# Patient Record
Sex: Female | Born: 1970 | Race: Black or African American | Hispanic: No | Marital: Single | Smoking: Never smoker
Health system: Southern US, Community
[De-identification: ages and names within clinical notes are randomized; demographics above are authoritative.]

## PROBLEM LIST (undated history)

## (undated) DIAGNOSIS — E559 Vitamin D deficiency, unspecified: Secondary | ICD-10-CM

## (undated) DIAGNOSIS — I10 Essential (primary) hypertension: Secondary | ICD-10-CM

## (undated) DIAGNOSIS — D219 Benign neoplasm of connective and other soft tissue, unspecified: Secondary | ICD-10-CM

## (undated) DIAGNOSIS — E876 Hypokalemia: Secondary | ICD-10-CM

## (undated) DIAGNOSIS — D649 Anemia, unspecified: Secondary | ICD-10-CM

## (undated) DIAGNOSIS — I272 Pulmonary hypertension, unspecified: Secondary | ICD-10-CM

## (undated) HISTORY — DX: Vitamin D deficiency, unspecified: E55.9

## (undated) HISTORY — DX: Anemia, unspecified: D64.9

## (undated) HISTORY — DX: Essential (primary) hypertension: I10

## (undated) HISTORY — DX: Hypomagnesemia: E83.42

## (undated) HISTORY — DX: Pulmonary hypertension, unspecified: I27.20

## (undated) HISTORY — DX: Hypokalemia: E87.6

---

## 1997-10-25 ENCOUNTER — Inpatient Hospital Stay (HOSPITAL_COMMUNITY): Admission: AD | Admit: 1997-10-25 | Discharge: 1997-10-27 | Payer: Self-pay | Admitting: Obstetrics & Gynecology

## 2002-08-28 ENCOUNTER — Other Ambulatory Visit: Admission: RE | Admit: 2002-08-28 | Discharge: 2002-08-28 | Payer: Self-pay | Admitting: Internal Medicine

## 2003-08-14 ENCOUNTER — Other Ambulatory Visit: Admission: RE | Admit: 2003-08-14 | Discharge: 2003-08-14 | Payer: Self-pay | Admitting: Internal Medicine

## 2004-08-25 ENCOUNTER — Other Ambulatory Visit: Admission: RE | Admit: 2004-08-25 | Discharge: 2004-08-25 | Payer: Self-pay | Admitting: Internal Medicine

## 2008-12-28 ENCOUNTER — Ambulatory Visit: Payer: Self-pay

## 2008-12-28 ENCOUNTER — Encounter: Payer: Self-pay | Admitting: Cardiology

## 2010-06-14 LAB — HM PAP SMEAR: HM PAP: NORMAL

## 2013-06-14 ENCOUNTER — Encounter: Payer: Self-pay | Admitting: Physician Assistant

## 2013-06-15 ENCOUNTER — Encounter: Payer: Self-pay | Admitting: Physician Assistant

## 2013-06-15 ENCOUNTER — Ambulatory Visit (INDEPENDENT_AMBULATORY_CARE_PROVIDER_SITE_OTHER): Payer: 59 | Admitting: Physician Assistant

## 2013-06-15 ENCOUNTER — Ambulatory Visit (HOSPITAL_COMMUNITY)
Admission: RE | Admit: 2013-06-15 | Discharge: 2013-06-15 | Disposition: A | Discharge status: Home / Self Care | Payer: 59 | Source: Ambulatory Visit | Attending: Physician Assistant | Admitting: Physician Assistant

## 2013-06-15 VITALS — BP 110/68 | HR 80 | Temp 97.9°F | Resp 16 | Ht 63.5 in | Wt 142.0 lb

## 2013-06-15 DIAGNOSIS — Z0000 Encounter for general adult medical examination without abnormal findings: Secondary | ICD-10-CM

## 2013-06-15 DIAGNOSIS — E559 Vitamin D deficiency, unspecified: Secondary | ICD-10-CM | POA: Insufficient documentation

## 2013-06-15 DIAGNOSIS — I10 Essential (primary) hypertension: Secondary | ICD-10-CM | POA: Insufficient documentation

## 2013-06-15 DIAGNOSIS — I272 Pulmonary hypertension, unspecified: Secondary | ICD-10-CM

## 2013-06-15 DIAGNOSIS — Z23 Encounter for immunization: Secondary | ICD-10-CM

## 2013-06-15 DIAGNOSIS — D649 Anemia, unspecified: Secondary | ICD-10-CM

## 2013-06-15 LAB — BASIC METABOLIC PANEL WITH GFR
BUN: 14 mg/dL (ref 6–23)
CHLORIDE: 103 meq/L (ref 96–112)
CO2: 26 mEq/L (ref 19–32)
Calcium: 9.2 mg/dL (ref 8.4–10.5)
Creat: 0.67 mg/dL (ref 0.50–1.10)
GFR, Est African American: >89 mL/min
GLUCOSE: 95 mg/dL (ref 70–99)
POTASSIUM: 3.8 meq/L (ref 3.5–5.3)
SODIUM: 138 meq/L (ref 135–145)

## 2013-06-15 LAB — IRON AND TIBC
%SAT: 3 % — ABNORMAL LOW (ref 20–55)
Iron: 14 ug/dL — ABNORMAL LOW (ref 42–145)
TIBC: 440 ug/dL (ref 250–470)
UIBC: 426 ug/dL — AB (ref 125–400)

## 2013-06-15 LAB — MAGNESIUM: Magnesium: 1.7 mg/dL (ref 1.5–2.5)

## 2013-06-15 LAB — CBC WITH DIFFERENTIAL/PLATELET
BASOS ABS: 0 10*3/uL (ref 0.0–0.1)
Basophils Relative: 0 % (ref 0–1)
Eosinophils Absolute: 0.1 10*3/uL (ref 0.0–0.7)
Eosinophils Relative: 2 % (ref 0–5)
HEMATOCRIT: 29.4 % — AB (ref 36.0–46.0)
HEMOGLOBIN: 9.6 g/dL — AB (ref 12.0–15.0)
LYMPHS PCT: 36 % (ref 12–46)
Lymphs Abs: 1.5 10*3/uL (ref 0.7–4.0)
MCH: 26.2 pg (ref 26.0–34.0)
MCHC: 32.7 g/dL (ref 30.0–36.0)
MCV: 80.3 fL (ref 78.0–100.0)
MONO ABS: 0.5 10*3/uL (ref 0.1–1.0)
MONOS PCT: 11 % (ref 3–12)
NEUTROS ABS: 2 10*3/uL (ref 1.7–7.7)
NEUTROS PCT: 51 % (ref 43–77)
Platelets: 324 10*3/uL (ref 150–400)
RBC: 3.66 MIL/uL — AB (ref 3.87–5.11)
RDW: 14.1 % (ref 11.5–15.5)
WBC: 4.1 10*3/uL (ref 4.0–10.5)

## 2013-06-15 LAB — FERRITIN: FERRITIN: 4 ng/mL — AB (ref 10–291)

## 2013-06-15 LAB — HEPATIC FUNCTION PANEL
ALK PHOS: 60 U/L (ref 39–117)
ALT: 8 U/L (ref 0–35)
AST: 13 U/L (ref 0–37)
Albumin: 3.9 g/dL (ref 3.5–5.2)
BILIRUBIN DIRECT: 0.1 mg/dL (ref 0.0–0.3)
BILIRUBIN INDIRECT: 0.1 mg/dL (ref 0.0–0.9)
TOTAL PROTEIN: 7.4 g/dL (ref 6.0–8.3)
Total Bilirubin: 0.2 mg/dL — ABNORMAL LOW (ref 0.3–1.2)

## 2013-06-15 LAB — URINALYSIS, ROUTINE W REFLEX MICROSCOPIC
Bilirubin Urine: NEGATIVE
Glucose, UA: NEGATIVE mg/dL
HGB URINE DIPSTICK: NEGATIVE
Ketones, ur: NEGATIVE mg/dL
LEUKOCYTES UA: NEGATIVE
NITRITE: NEGATIVE
PH: 5.5 (ref 5.0–8.0)
PROTEIN: NEGATIVE mg/dL
Specific Gravity, Urine: 1.021 (ref 1.005–1.030)
Urobilinogen, UA: 0.2 mg/dL (ref 0.0–1.0)

## 2013-06-15 LAB — LIPID PANEL
Cholesterol: 146 mg/dL (ref 0–200)
HDL: 44 mg/dL (ref >39)
LDL CALC: 92 mg/dL (ref 0–99)
TRIGLYCERIDES: 48 mg/dL (ref <150)
Total CHOL/HDL Ratio: 3.3 Ratio
VLDL: 10 mg/dL (ref 0–40)

## 2013-06-15 LAB — MICROALBUMIN / CREATININE URINE RATIO
Creatinine, Urine: 131.6 mg/dL
Microalb Creat Ratio: 4.9 mg/g (ref 0.0–30.0)
Microalb, Ur: 0.64 mg/dL (ref 0.00–1.89)

## 2013-06-15 LAB — HEMOGLOBIN A1C
Hgb A1c MFr Bld: 5 % (ref <5.7)
MEAN PLASMA GLUCOSE: 97 mg/dL (ref <117)

## 2013-06-15 LAB — TSH: TSH: 1.352 u[IU]/mL (ref 0.350–4.500)

## 2013-06-15 LAB — VITAMIN B12: Vitamin B-12: 667 pg/mL (ref 211–911)

## 2013-06-15 NOTE — Patient Instructions (Signed)
Please record yourself for possible sleep apnea Please follow up with your OB/GYN, this is very important for you PAP and MGM!  Get on an allergy pill like zyrtec/certizine at night.     Bad carbs also include fruit juice, alcohol, and sweet tea. These are empty calories that do not signal to your brain that you are full.   Please remember the good carbs are still carbs which convert into sugar. So please measure them out no more than 1/2-1 cup of rice, oatmeal, pasta, and beans.  Veggies are however free foods! Pile them on.   I like lean protein at every meal such as chicken, [place], pork chops, cottage cheese, etc. Just do not fry these meats and please center your meal around vegetable, the meats should be a side dish.   No all fruit is created equal. Please see the list below, the fruit at the bottom is higher in sugars than the fruit at the top   Sleep Apnea  Sleep apnea is a sleep disorder characterized by abnormal pauses in breathing while you sleep. When your breathing pauses, the level of oxygen in your blood decreases. This causes you to move out of deep sleep and into light sleep. As a result, your quality of sleep is poor, and the system that carries your blood throughout your body (cardiovascular system) experiences stress. If sleep apnea remains untreated, the following conditions can develop:  High blood pressure (hypertension).  Coronary artery disease.  Inability to achieve or maintain an erection (impotence).  Impairment of your thought process (cognitive dysfunction). There are three types of sleep apnea: 1. Obstructive sleep apnea Pauses in breathing during sleep because of a blocked airway. 2. Central sleep apnea Pauses in breathing during sleep because the area of the brain that controls your breathing does not send the correct signals to the muscles that control breathing. 3. Mixed sleep apnea A combination of both obstructive and central sleep apnea. RISK  FACTORS The following risk factors can increase your risk of developing sleep apnea:  Being overweight.  Smoking.  Having narrow passages in your nose and throat.  Being of older age.  Being female.  Alcohol use.  Sedative and tranquilizer use.  Ethnicity. Among individuals younger than 35 years, African Americans are at increased risk of sleep apnea. SYMPTOMS   Difficulty staying asleep.  Daytime sleepiness and fatigue.  Loss of energy.  Irritability.  Loud, heavy snoring.  Morning headaches.  Trouble concentrating.  Forgetfulness.  Decreased interest in sex. DIAGNOSIS  In order to diagnose sleep apnea, your caregiver will perform a physical examination. Your caregiver may suggest that you take a home sleep test. Your caregiver may also recommend that you spend the night in a sleep lab. In the sleep lab, several monitors record information about your heart, lungs, and brain while you sleep. Your leg and arm movements and blood oxygen level are also recorded. TREATMENT The following actions may help to resolve mild sleep apnea:  Sleeping on your side.   Using a decongestant if you have nasal congestion.   Avoiding the use of depressants, including alcohol, sedatives, and narcotics.   Losing weight and modifying your diet if you are overweight. There also are devices and treatments to help open your airway:  Oral appliances. These are custom-made mouthpieces that shift your lower jaw forward and slightly open your bite. This opens your airway.  Devices that create positive airway pressure. This positive pressure "splints" your airway open to help you  breathe better during sleep. The following devices create positive airway pressure:  Continuous positive airway pressure (CPAP) device. The CPAP device creates a continuous level of air pressure with an air pump. The air is delivered to your airway through a mask while you sleep. This continuous pressure keeps your  airway open.  Nasal expiratory positive airway pressure (EPAP) device. The EPAP device creates positive air pressure as you exhale. The device consists of single-use valves, which are inserted into each nostril and held in place by adhesive. The valves create very little resistance when you inhale but create much more resistance when you exhale. That increased resistance creates the positive airway pressure. This positive pressure while you exhale keeps your airway open, making it easier to breath when you inhale again.  Bilevel positive airway pressure (BPAP) device. The BPAP device is used mainly in patients with central sleep apnea. This device is similar to the CPAP device because it also uses an air pump to deliver continuous air pressure through a mask. However, with the BPAP machine, the pressure is set at two different levels. The pressure when you exhale is lower than the pressure when you inhale.  Surgery. Typically, surgery is only done if you cannot comply with less invasive treatments or if the less invasive treatments do not improve your condition. Surgery involves removing excess tissue in your airway to create a wider passage way. Document Released: 05/08/2002 Document Revised: 09/12/2012 Document Reviewed: 09/24/2011 [facility] Patient Information 2014 [place].

## 2013-06-15 NOTE — Progress Notes (Signed)
Complete Physical HPI Patient presents for complete physical.   Patient's blood pressure has been controlled at home. Patient denies chest pain, shortness of breath, dizziness. BP: 110/68 mmHg  Patient's cholesterol is diet controlled. The patient's cholesterol last visit was LDL 109. , HDL 42.  + allergy symptoms not on a medication.  In June her potassium was 3.3, she is on 25 HCTZ. Also in June her Iron was 224 but her H/H was 10.9 (11.1)/34.4. Magnesium was 1.7 last time too, she is not on mag.  Her vitamin D was 45.  On menses now and they are regular.   Current Medications:  Current Outpatient Prescriptions on File Prior to Visit  Medication Sig Dispense Refill  . lisinopril-hydrochlorothiazide (PRINZIDE,ZESTORETIC) 20-25 MG per tablet Take 1 tablet by mouth daily.       No current facility-administered medications on file prior to visit.   Health Maintenance:  Tetanus: 2003 needs Pneumovax: 2003 Flu vaccine: declines Zostavax: N/A Pap: neg 2012 MGM: never had one.  DEXA: N/A Colonoscopy: N/A EGD: N/A  Allergies: No Known Allergies Medical History:  Past Medical History  Diagnosis Date  . Hypertension   . Allergy   . Vitamin D deficiency   . Anemia   . Pulmonary hypertension Via Echo 2010    EF 65% RVSP 45   Surgical History: No past surgical history on file. Family History:  Family History  Problem Relation Age of Onset  . Hypertension Mother   . Hyperlipidemia Mother   . Hyperlipidemia Father   . Hypertension Father    Social History:  History   Social History  . Marital Status: Single    Spouse Name: N/A    Number of Children: N/A  . Years of Education: N/A   Occupational History  . Not on file.   Social History Main Topics  . Smoking status: Never Smoker   . Smokeless tobacco: Never Used  . Alcohol Use: No  . Drug Use: No  . Sexual Activity: Not on file   Other Topics Concern  . Not on file   Social History Narrative  . No narrative on  file   ROS Constitutional: Denies weight loss/gain, headaches, insomnia, fatigue, night sweats, and change in appetite. Eyes: Denies redness, blurred vision, diplopia, discharge, itchy, watery eyes.  ENT: Denies discharge, congestion, post nasal drip, sore throat, earache, hearing loss, dental pain, Tinnitus, Vertigo, Sinus pain, snoring.  Cardio: Denies chest pain, palpitations, irregular heartbeat, dyspnea, diaphoresis, orthopnea, PND, claudication, edema Respiratory: denies cough, dyspnea, pleurisy, hoarseness, wheezing.  Gastrointestinal: Denies dysphagia, heartburn, pain, cramps, nausea, vomiting, bloating, diarrhea, constipation, hematemesis, melena, hematochezia, hemorrhoids Genitourinary: Denies dysuria, frequency, urgency, nocturia, hesitancy, discharge, hematuria, flank pain Breast: Denies Breast lumps, nipple discharge, bleeding.  Musculoskeletal: Denies arthralgia, myalgia, stiffness, Jt. Swelling, pain, Skin: Denies pruritis, rash, hives,  acne, eczema, changing in skin lesion Neuro: Denies Weakness, tremor, incoordination, spasms, paresthesia, pain Psychiatric: Denies confusion, memory loss, sensory loss Endocrine: Denies change in weight, skin, hair change, nocturia, and paresthesia, Diabetic Denies Polys, visual blurring, hyper /hypo glycemic episodes.  Heme/Lymph: Denies Excessive bleeding, bruising, enlarged lymph nodes  Physical Exam: Estimated body mass index is 24.76 kg/(m^2) as calculated from the following:   Height as of this encounter: 5' 3.5" (1.613 m).   Weight as of this encounter: 142 lb (64.411 kg). Filed Vitals:   06/15/13 0847  BP: 110/68  Pulse: 80  Temp: 97.9 F (36.6 C)  Resp: 16   General Appearance: Well nourished, in no  apparent distress. Eyes: PERRLA, EOMs, conjunctiva no swelling or erythema, normal fundi and vessels. Sinuses: No Frontal/maxillary tenderness ENT/Mouth: Ext aud canals clear, normal light reflex with TMs without erythema,  bulging.  Good dentition. No erythema, swelling, or exudate on post pharynx. Tonsils not swollen or erythematous. Hearing normal.  Neck: Supple, thyroid normal. No bruits Respiratory: Respiratory effort normal, BS equal bilaterally without rales, rhonchi, wheezing or stridor. Cardio: RRR without murmurs, rubs or gallops. Brisk peripheral pulses without edema.  Chest: symmetric, with normal excursions and percussion. Breasts:defer Abdomen: Soft, +BS. Non tender, no guarding, rebound, hernias, masses, or organomegaly. .  Lymphatics: Non tender without lymphadenopathy.  Genitourinary: defer Musculoskeletal: Full ROM all peripheral extremities,5/5 strength, and normal gait. Skin: Warm, dry without rashes, lesions, ecchymosis.  Neuro: Cranial nerves intact, reflexes equal bilaterally. Normal muscle tone, no cerebellar symptoms. Sensation intact.  Psych: Awake and oriented X 3, normal affect, Insight and Judgment appropriate.   EKG: WNL no changes.  Assessment and Plan: Hypertension- continue medication, may have to stop HCTZ  Allergy- continue medication  Vitamin D deficiency- check level  Anemia- check labs  Pulmonary hypertension- get CXR, ? Sleep apnea- does not want a study at this time.   Hypokalemia- check BMP, we may have to stop the HCTZ or decrease it.  TDAP MGM and PAP at OB/GYN  Discussed med's effects and SE's. Screening labs and tests as requested with regular follow-up as recommended.   Vicie Mutters 8:54 AM

## 2013-06-16 LAB — INSULIN, FASTING: Insulin fasting, serum: 11 u[IU]/mL (ref 3–28)

## 2013-06-16 LAB — FOLATE RBC: RBC Folate: 757 ng/mL (ref >280)

## 2013-06-16 LAB — VITAMIN D 25 HYDROXY (VIT D DEFICIENCY, FRACTURES): VIT D 25 HYDROXY: 80 ng/mL (ref 30–89)

## 2013-06-24 ENCOUNTER — Other Ambulatory Visit: Payer: Self-pay | Admitting: Physician Assistant

## 2013-12-19 ENCOUNTER — Ambulatory Visit (INDEPENDENT_AMBULATORY_CARE_PROVIDER_SITE_OTHER): Payer: 59 | Admitting: Physician Assistant

## 2013-12-19 ENCOUNTER — Encounter: Payer: Self-pay | Admitting: Physician Assistant

## 2013-12-19 VITALS — BP 120/78 | HR 88 | Temp 98.1°F | Resp 16 | Wt 137.0 lb

## 2013-12-19 DIAGNOSIS — D649 Anemia, unspecified: Secondary | ICD-10-CM

## 2013-12-19 DIAGNOSIS — E876 Hypokalemia: Secondary | ICD-10-CM

## 2013-12-19 DIAGNOSIS — E559 Vitamin D deficiency, unspecified: Secondary | ICD-10-CM

## 2013-12-19 DIAGNOSIS — I10 Essential (primary) hypertension: Secondary | ICD-10-CM

## 2013-12-19 LAB — CBC WITH DIFFERENTIAL/PLATELET
BASOS ABS: 0 10*3/uL (ref 0.0–0.1)
Basophils Relative: 0 % (ref 0–1)
Eosinophils Absolute: 0.1 10*3/uL (ref 0.0–0.7)
Eosinophils Relative: 1 % (ref 0–5)
HCT: 28.9 % — ABNORMAL LOW (ref 36.0–46.0)
Hemoglobin: 9.3 g/dL — ABNORMAL LOW (ref 12.0–15.0)
Lymphocytes Relative: 21 % (ref 12–46)
Lymphs Abs: 1.4 10*3/uL (ref 0.7–4.0)
MCH: 23.7 pg — ABNORMAL LOW (ref 26.0–34.0)
MCHC: 32.2 g/dL (ref 30.0–36.0)
MCV: 73.7 fL — ABNORMAL LOW (ref 78.0–100.0)
Monocytes Absolute: 0.5 10*3/uL (ref 0.1–1.0)
Monocytes Relative: 8 % (ref 3–12)
NEUTROS ABS: 4.7 10*3/uL (ref 1.7–7.7)
Neutrophils Relative %: 70 % (ref 43–77)
PLATELETS: 340 10*3/uL (ref 150–400)
RBC: 3.92 MIL/uL (ref 3.87–5.11)
RDW: 15.3 % (ref 11.5–15.5)
WBC: 6.7 10*3/uL (ref 4.0–10.5)

## 2013-12-19 NOTE — Progress Notes (Addendum)
Assessment and Plan:  Hypertension: Continue medication, monitor blood pressure at home. Continue DASH diet. Hypokalemia- check BMP Hypomagnesia- check Mag Iron def anemia- check iron/CBC, follow up with OB/GYN, send back hemoccult.  Vitamin D Def- check level and continue medications.   Continue diet and meds as discussed. Further disposition pending results of labs.  Addendum: Labs show hypokalemia and hyponatremia- will stop the HCTZ and increase lisinopril to 40mg  daily Anemia is worse, encouraged to get hemoccult card, follow up OB/GYN and we will refer to GI.  Will follow up in 1 month  HPI 43 y.o. female  presents for 3 month follow up with hypertension, hyperlipidemia, prediabetes and vitamin D. Her blood pressure has been controlled at home, today their BP is BP: 120/78 mmHg She does workout. She denies chest pain, shortness of breath, dizziness.  She is not on cholesterol medication and denies myalgias. Her cholesterol is at goal. The cholesterol last visit was:   Lab Results  Component Value Date   CHOL 146 06/15/2013   HDL 44 06/15/2013   LDLCALC 92 06/15/2013   TRIG 48 06/15/2013   CHOLHDL 3.3 06/15/2013    Last A1C in the office was:  Lab Results  Component Value Date   HGBA1C 5.0 06/15/2013   Patient is on Vitamin D supplement.   Lab Results  Component Value Date   VD25OH 39 06/15/2013     She has had a low potassium in the past, she is on magnesium and OTC potassium pill, denies muscle cramps.  Lab Results  Component Value Date   CREATININE 0.67 06/15/2013   BUN 14 06/15/2013   NA 138 06/15/2013   K 3.8 06/15/2013   CL 103 06/15/2013   CO2 26 06/15/2013   Her iron was very very low last time and she was suppose to follow up for labs but never did, did not follow up with OB/GYN either. Will recheck iron/CBC today.  Lab Results  Component Value Date   WBC 4.1 06/15/2013   HGB 9.6* 06/15/2013   HCT 29.4* 06/15/2013   MCV 80.3 06/15/2013   PLT 324 06/15/2013   Lab  Results  Component Value Date   IRON 14* 06/15/2013   TIBC 440 06/15/2013   FERRITIN 4* 06/15/2013     Current Medications:  Current Outpatient Prescriptions on File Prior to Visit  Medication Sig Dispense Refill  . lisinopril-hydrochlorothiazide (PRINZIDE,ZESTORETIC) 20-25 MG per tablet TAKE ONE TABLET BY MOUTH ONCE DAILY  90 tablet  1   No current facility-administered medications on file prior to visit.   Medical History:  Past Medical History  Diagnosis Date  . Hypertension   . Allergy   . Vitamin D deficiency   . Anemia   . Pulmonary hypertension Via Echo 2010    EF 65% RVSP 45 (Dr. Stanford Breed)   Allergies: No Known Allergies   Review of Systems: [X]  = complains of  [ ]  = denies  General: Fatigue [ X] Fever [ ]  Chills [ ]  Weakness [ ]   Insomnia [ ]  Eyes: Redness [ ]  Blurred vision [ ]  Diplopia [ ]   ENT: Congestion [ ]  Sinus Pain [ ]  Post Nasal Drip [ ]  Sore Throat [ ]  Earache [ ]   Cardiac: Chest pain/pressure [ ]  SOB [ ]  Orthopnea [ ]   Palpitations [ ]   Paroxysmal nocturnal dyspnea[ ]  Claudication [ ]  Edema [ ]   Pulmonary: Cough [ ]  Wheezing[ ]   SOB [ ]   Snoring [ ]   GI: Nausea [ ]   Vomiting[ ]  Dysphagia[ ]  Heartburn[ ]  Abdominal pain [ ]  Constipation [ ] ; Diarrhea [ ] ; BRBPR [ ]  Melena[ ]  GU: Hematuria[ ]  Dysuria [ ]  Nocturia[ ]  Urgency [ ]   Hesitancy [ ]  Discharge [ ]  Neuro: Headaches[ ]  Vertigo[ ]  Paresthesias[ ]  Spasm [ ]  Speech changes [ ]  Incoordination [ ]   Ortho: Arthritis [ ]  Joint pain [ ]  Muscle pain [ ]  Joint swelling [ ]  Back Pain [ ]  Skin:  Rash [ ]   Pruritis [ ]  Change in skin lesion [ ]   Psych: Depression[ ]  Anxiety[ ]  Confusion [ ]  Memory loss [ ]   Heme/Lypmh: Bleeding [ ]  Bruising [ ]  Enlarged lymph nodes [ ]   Endocrine: Visual blurring [ ]  Paresthesia [ ]  Polyuria [ ]  Polydypsea [ ]    Heat/cold intolerance [ ]  Hypoglycemia [ ]   Family history- Review and unchanged Social history- Review and unchanged Physical Exam: BP 120/78  Pulse 88  Temp(Src) 98.1  F (36.7 C)  Resp 16  Wt 137 lb (62.143 kg) Wt Readings from Last 3 Encounters:  12/19/13 137 lb (62.143 kg)  06/15/13 142 lb (64.411 kg)   General Appearance: Well nourished, in no apparent distress. Eyes: PERRLA, EOMs, conjunctiva no swelling or erythema Sinuses: No Frontal/maxillary tenderness ENT/Mouth: Ext aud canals clear, TMs without erythema, bulging. No erythema, swelling, or exudate on post pharynx.  Tonsils not swollen or erythematous. Hearing normal.  Neck: Supple, thyroid normal.  Respiratory: Respiratory effort normal, BS equal bilaterally without rales, rhonchi, wheezing or stridor.  Cardio: RRR with no MRGs. Brisk peripheral pulses without edema.  Abdomen: Soft, + BS.  Non tender, no guarding, rebound, hernias, masses. Lymphatics: Non tender without lymphadenopathy.  Musculoskeletal: Full ROM, 5/5 strength, normal gait.  Skin: Warm, dry without rashes, lesions, ecchymosis.  Neuro: Cranial nerves intact. Normal muscle tone, no cerebellar symptoms. Sensation intact.  Psych: Awake and oriented X 3, normal affect, Insight and Judgment appropriate.    Vicie Mutters 5:02 PM

## 2013-12-19 NOTE — Patient Instructions (Signed)
Need to follow up with OB/GYN Need to send back hemoccult card   Potassium Content of Foods  The body needs potassium to control blood pressure and to keep the muscles and nervous system healthy. Here are some healthy foods below that are high in potassium. Also you can get the white lab of "NO SALT" salt substitute, 1/4 teaspoon of this is equivalent to 32meq potassium.   FOODS AND DRINKS HIGH IN POTASSIUM FOODS MODERATE IN POTASSIUM   Fruits  Avocado (cubed),  c / 50 g.  Banana (sliced), 75 g.  Cantaloupe (cubed), 80 g.  Honeydew, 1 wedge / 85 g.  Kiwi (sliced), 90 g.  Nectarine, 1 small / 129 g.  Orange, 1 medium / 131 g. Vegetables  Artichoke,  of a medium / 64 g.  Asparagus (boiled), 90 g..  Broccoli (boiled), 78 g.  Brussels sprout (boiled), 78 g.  Butternut squash (baked), 103 g.  Chickpea (cooked), 82 g.  Green peas (cooked), 80 g.  Kidney beans (cooked), 5 tbsp / 55 g.  Lima beans (cooked),  c / 43 g.  Navy beans (cooked),  c / 61 g.  Spinach (cooked),  c / 45 g.  Sweet potato (baked),  c / 50 g.  Tomato (chopped or sliced), 90 g.  Vegetable juice.  White mushrooms (cooked), 78 g.  Yam (cooked or baked),  c / 34 g.  Zucchini squash (boiled), 90 g. Other Foods and Drinks  Almonds (whole),  c / 36 g.  Fish, 3 oz / 85 g.  Nonfat fruit variety yogurt, 123 g.  Pistachio nuts, 1 oz / 28 g.  Pumpkin seeds, 1 oz / 28 g.  Red meat (broiled, cooked, grilled), 3 oz / 85 g.  Scallops (steamed), 3 oz / 85 g.  Spaghetti sauce,  c / 66 g.  Sunflower seeds (dry roasted), 1 oz / 28 g.  Veggie burger, 1 patty / 70 g. Fruits  Grapefruit,  of the fruit / 098 g  Plums (sliced), 83 g.  Tangerine, 1 large / 120 g. Vegetables  Carrots (boiled), 78 g.  Carrots (sliced), 61 g.  Rhubarb (cooked with sugar), 120 g.  Rutabaga (cooked), 120 g.  Yellow snap beans (cooked), 63 g. Other Foods and Drinks   Chicken breast (roasted and  chopped),  c / 70 g.  Pita bread, 1 large / 64 g.  Shrimp (steamed), 4 oz / 113 g.  Swiss cheese (diced), 70 g.     Anemia, Nonspecific Anemia is a condition in which the concentration of red blood cells or hemoglobin in the blood is below normal. Hemoglobin is a substance in red blood cells that carries oxygen to the tissues of the body. Anemia results in not enough oxygen reaching these tissues.  CAUSES  Common causes of anemia include:   Excessive bleeding. Bleeding may be internal or external. This includes excessive bleeding from periods (in women) or from the intestine.   Poor nutrition.   Chronic kidney, thyroid, and liver disease.  Bone marrow disorders that decrease red blood cell production.  Cancer and treatments for cancer.  HIV, AIDS, and their treatments.  Spleen problems that increase red blood cell destruction.  Blood disorders.  Excess destruction of red blood cells due to infection, medicines, and autoimmune disorders. SIGNS AND SYMPTOMS   Minor weakness.   Dizziness.   Headache.  Palpitations.   Shortness of breath, especially with exercise.   Paleness.  Cold sensitivity.  Indigestion.  Nausea.  Difficulty sleeping.  Difficulty concentrating. Symptoms may occur suddenly or they may develop slowly.  DIAGNOSIS  Additional blood tests are often needed. These help your health care provider determine the best treatment. Your health care provider will check your stool for blood and look for other causes of blood loss.  TREATMENT  Treatment varies depending on the cause of the anemia. Treatment can include:   Supplements of iron, vitamin L57, or folic acid.   Hormone medicines.   A blood transfusion. This may be needed if blood loss is severe.   Hospitalization. This may be needed if there is significant continual blood loss.   Dietary changes.  Spleen removal. HOME CARE INSTRUCTIONS Keep all follow-up appointments. It  often takes many weeks to correct anemia, and having your health care provider check on your condition and your response to treatment is very important. SEEK IMMEDIATE MEDICAL CARE IF:   You develop extreme weakness, shortness of breath, or chest pain.   You become dizzy or have trouble concentrating.  You develop heavy vaginal bleeding.   You develop a rash.   You have bloody or black, tarry stools.   You faint.   You vomit up blood.   You vomit repeatedly.   You have abdominal pain.  You have a fever or persistent symptoms for more than 2-3 days.   You have a fever and your symptoms suddenly get worse.   You are dehydrated.  MAKE SURE YOU:  Understand these instructions.  Will watch your condition.  Will get help right away if you are not doing well or get worse. Document Released: 06/25/2004 Document Revised: 01/18/2013 Document Reviewed: 11/11/2012 [facility] Patient Information 2015 [place], [place]. This information is not intended to replace advice given to you by your health care provider. Make sure you discuss any questions you have with your health care provider.

## 2013-12-20 LAB — BASIC METABOLIC PANEL WITH GFR
BUN: 12 mg/dL (ref 6–23)
CO2: 29 mEq/L (ref 19–32)
Calcium: 9.7 mg/dL (ref 8.4–10.5)
Chloride: 97 mEq/L (ref 96–112)
Creat: 0.68 mg/dL (ref 0.50–1.10)
Glucose, Bld: 83 mg/dL (ref 70–99)
POTASSIUM: 3.4 meq/L — AB (ref 3.5–5.3)
SODIUM: 134 meq/L — AB (ref 135–145)

## 2013-12-20 LAB — IRON AND TIBC
%SAT: 13 % — AB (ref 20–55)
Iron: 64 ug/dL (ref 42–145)
TIBC: 488 ug/dL — ABNORMAL HIGH (ref 250–470)
UIBC: 424 ug/dL — ABNORMAL HIGH (ref 125–400)

## 2013-12-20 LAB — HEPATIC FUNCTION PANEL
ALT: 10 U/L (ref 0–35)
AST: 17 U/L (ref 0–37)
Albumin: 4.3 g/dL (ref 3.5–5.2)
Alkaline Phosphatase: 59 U/L (ref 39–117)
BILIRUBIN INDIRECT: 0.4 mg/dL (ref 0.2–1.2)
Bilirubin, Direct: 0.1 mg/dL (ref 0.0–0.3)
Total Bilirubin: 0.5 mg/dL (ref 0.2–1.2)
Total Protein: 7.8 g/dL (ref 6.0–8.3)

## 2013-12-20 LAB — VITAMIN D 25 HYDROXY (VIT D DEFICIENCY, FRACTURES): VIT D 25 HYDROXY: 35 ng/mL (ref 30–89)

## 2013-12-20 LAB — FERRITIN: FERRITIN: 4 ng/mL — AB (ref 10–291)

## 2013-12-20 LAB — MAGNESIUM: MAGNESIUM: 1.8 mg/dL (ref 1.5–2.5)

## 2013-12-20 LAB — TSH: TSH: 1.347 u[IU]/mL (ref 0.350–4.500)

## 2013-12-20 MED ORDER — LISINOPRIL 40 MG PO TABS: 40.0000 mg | ORAL_TABLET | Freq: Every day | ORAL | Status: DC

## 2013-12-20 NOTE — Addendum Note (Signed)
Addended by: Vladimir Crofts on: 12/20/2013 08:43 AM   Modules accepted: Orders

## 2013-12-26 ENCOUNTER — Encounter: Payer: Self-pay | Admitting: Internal Medicine

## 2014-01-22 ENCOUNTER — Other Ambulatory Visit (INDEPENDENT_AMBULATORY_CARE_PROVIDER_SITE_OTHER): Payer: 59

## 2014-01-22 ENCOUNTER — Other Ambulatory Visit: Payer: Self-pay

## 2014-01-22 ENCOUNTER — Encounter (INDEPENDENT_AMBULATORY_CARE_PROVIDER_SITE_OTHER): Payer: Self-pay

## 2014-01-22 DIAGNOSIS — E876 Hypokalemia: Secondary | ICD-10-CM

## 2014-01-22 DIAGNOSIS — D649 Anemia, unspecified: Secondary | ICD-10-CM

## 2014-01-22 LAB — CBC WITH DIFFERENTIAL/PLATELET
Basophils Absolute: 0 10*3/uL (ref 0.0–0.1)
Basophils Relative: 0 % (ref 0–1)
Eosinophils Absolute: 0.2 10*3/uL (ref 0.0–0.7)
Eosinophils Relative: 3 % (ref 0–5)
HCT: 32.8 % — ABNORMAL LOW (ref 36.0–46.0)
HEMOGLOBIN: 10.6 g/dL — AB (ref 12.0–15.0)
Lymphocytes Relative: 33 % (ref 12–46)
Lymphs Abs: 1.9 10*3/uL (ref 0.7–4.0)
MCH: 26.2 pg (ref 26.0–34.0)
MCHC: 32.3 g/dL (ref 30.0–36.0)
MCV: 81 fL (ref 78.0–100.0)
MONOS PCT: 7 % (ref 3–12)
Monocytes Absolute: 0.4 10*3/uL (ref 0.1–1.0)
NEUTROS ABS: 3.4 10*3/uL (ref 1.7–7.7)
NEUTROS PCT: 57 % (ref 43–77)
Platelets: 269 10*3/uL (ref 150–400)
RBC: 4.05 MIL/uL (ref 3.87–5.11)
RDW: 19.4 % — ABNORMAL HIGH (ref 11.5–15.5)
WBC: 5.9 10*3/uL (ref 4.0–10.5)

## 2014-01-22 LAB — BASIC METABOLIC PANEL WITH GFR
BUN: 15 mg/dL (ref 6–23)
CO2: 24 mEq/L (ref 19–32)
Calcium: 9.3 mg/dL (ref 8.4–10.5)
Chloride: 105 mEq/L (ref 96–112)
Creat: 0.68 mg/dL (ref 0.50–1.10)
GFR, Est Non African American: >89 mL/min
GLUCOSE: 102 mg/dL — AB (ref 70–99)
POTASSIUM: 3.7 meq/L (ref 3.5–5.3)
SODIUM: 139 meq/L (ref 135–145)

## 2014-01-22 LAB — IRON AND TIBC
%SAT: 94 % — ABNORMAL HIGH (ref 20–55)
Iron: 419 ug/dL — ABNORMAL HIGH (ref 42–145)
TIBC: 447 ug/dL (ref 250–470)
UIBC: 28 ug/dL — AB (ref 125–400)

## 2014-01-23 LAB — FERRITIN: Ferritin: 12 ng/mL (ref 10–291)

## 2014-02-01 ENCOUNTER — Encounter: Payer: Self-pay | Admitting: Gastroenterology

## 2014-03-05 ENCOUNTER — Encounter: Payer: Self-pay | Admitting: Internal Medicine

## 2014-03-05 ENCOUNTER — Ambulatory Visit (INDEPENDENT_AMBULATORY_CARE_PROVIDER_SITE_OTHER): Payer: 59 | Admitting: Internal Medicine

## 2014-03-05 VITALS — BP 162/98 | HR 92 | Ht 63.5 in | Wt 142.4 lb

## 2014-03-05 DIAGNOSIS — N920 Excessive and frequent menstruation with regular cycle: Secondary | ICD-10-CM

## 2014-03-05 DIAGNOSIS — D500 Iron deficiency anemia secondary to blood loss (chronic): Secondary | ICD-10-CM

## 2014-03-05 NOTE — Patient Instructions (Signed)
We have set you up an appointment with Dr. Arvella Nigh at [facility] for October 13th at 10:30am, arrive at 10:00am.  They are located at [place]. , phone # [phone].   I appreciate the opportunity to care for you.

## 2014-03-05 NOTE — Progress Notes (Signed)
HPI: Pt is a 43 yo female referred for evaluation of anemia. Pt says she has been anemic for 2 years. She started an OTC iron supplement a few months ago, and her ferritin level normalized and she was instructed to stop her iron 3 weeks ago. She has had no change in bowel habits or stool caliber. No loss of appetite or wt loss. Had occasional dark stool on iron but not since. No BRBPR. No epigastric pain, nausea or vomiting. Her LMP was 2 weeks ago. Her menses are every 28 days and are very heavy. She passes a lot of clots, and typically saturates 4-5 pads daily. She has cramping with menses.   Past Medical History  Diagnosis Date  . Hypertension   . Allergy   . Vitamin D deficiency   . Anemia   . Pulmonary hypertension Via Echo 2010    EF 65% RVSP 45 (Dr. Stanford Breed)  . Hypokalemia   . Hypomagnesemia       Family History  Problem Relation Age of Onset  . Hypertension Mother   . Hyperlipidemia Mother   . Hyperlipidemia Father   . Hypertension Father    History  Substance Use Topics  . Smoking status: Never Smoker   . Smokeless tobacco: Never Used  . Alcohol Use: No   Current Outpatient Prescriptions  Medication Sig Dispense Refill  . lisinopril (PRINIVIL,ZESTRIL) 40 MG tablet Take 1 tablet (40 mg total) by mouth daily.  90 tablet  0   No current facility-administered medications for this visit.   No Known Allergies   Review of Systems: All systems reviewed and negative except where noted in HPI.    LABS:  Ferritin 8 mos ago 4 Ferritin 1 mos ago 12  HgB 8 mos ago 9.6 HgB 1 mos ago 10.6  MCV 1 mos ago 81      Physical Exam: BP 162/98  Pulse 92  Ht 5' 3.5" (1.613 m)  Wt 142 lb 6.4 oz (64.592 kg)  BMI 24.83 kg/m2  LMP 02/21/2014 Constitutional: Pleasant,well-developed, well nourished, AAfemale in no acute distress. HEENT: Normocephalic and atraumatic. Conjunctivae are normal. No scleral icterus. Neck supple.  Cardiovascular: Normal rate, regular rhythm.   Pulmonary/chest: Effort normal and breath sounds normal. No wheezing, rales or rhonchi. Abdominal: Soft, nondistended, nontender. Bowel sounds active throughout. No hepatomegaly.The uterus is palpable and firm, 2 fingerbreaths above umbilicus Rectal: DRE with brown stool that tests heme neg Extremities: no edema Lymphadenopathy: No cervical adenopathy noted. Neurological: Alert and oriented to person place and time. Skin: Skin is warm and dry. No rashes noted. Psychiatric: Normal mood and affect. Behavior is normal.   ASSESSMENT AND PLAN: 1. Iron deficiency anemia. Pts anemia has improved with oral iron supplementation, but her HgB is still not normal. Anemia likely due to heavy menstrual blood loss. Pt has not seen GYN in several years. Will schedule GYN eval. Prior to pursuing any endoscopic evaluations. 2.Menorrhagia. ? If due to fibroids/adenomyosis . Pt to be schedule to see GYN.    Todd Jelinski, Vita Barley PA-C 03/05/2014, 3:44 PM  I have seen the patient with Ms. Michelina Mexicano and agree with above plans.  Cc Arvella Nigh, MD Alesia Richards, MD

## 2014-03-12 ENCOUNTER — Encounter: Payer: Self-pay | Admitting: Physician Assistant

## 2014-03-12 ENCOUNTER — Ambulatory Visit (INDEPENDENT_AMBULATORY_CARE_PROVIDER_SITE_OTHER): Payer: 59 | Admitting: Physician Assistant

## 2014-03-12 VITALS — BP 138/82 | HR 88 | Temp 98.1°F | Resp 16 | Ht 63.5 in | Wt 142.0 lb

## 2014-03-12 DIAGNOSIS — R6889 Other general symptoms and signs: Secondary | ICD-10-CM

## 2014-03-12 DIAGNOSIS — E876 Hypokalemia: Secondary | ICD-10-CM

## 2014-03-12 DIAGNOSIS — I10 Essential (primary) hypertension: Secondary | ICD-10-CM

## 2014-03-12 DIAGNOSIS — Z0001 Encounter for general adult medical examination with abnormal findings: Secondary | ICD-10-CM

## 2014-03-12 DIAGNOSIS — R1900 Intra-abdominal and pelvic swelling, mass and lump, unspecified site: Secondary | ICD-10-CM

## 2014-03-12 DIAGNOSIS — R1031 Right lower quadrant pain: Secondary | ICD-10-CM

## 2014-03-12 DIAGNOSIS — D509 Iron deficiency anemia, unspecified: Secondary | ICD-10-CM

## 2014-03-12 DIAGNOSIS — G8929 Other chronic pain: Secondary | ICD-10-CM

## 2014-03-12 LAB — CBC WITH DIFFERENTIAL/PLATELET
BASOS ABS: 0 10*3/uL (ref 0.0–0.1)
Basophils Relative: 0 % (ref 0–1)
EOS ABS: 0.1 10*3/uL (ref 0.0–0.7)
EOS PCT: 1 % (ref 0–5)
HCT: 33.5 % — ABNORMAL LOW (ref 36.0–46.0)
Hemoglobin: 11.2 g/dL — ABNORMAL LOW (ref 12.0–15.0)
Lymphocytes Relative: 24 % (ref 12–46)
Lymphs Abs: 1.4 10*3/uL (ref 0.7–4.0)
MCH: 27.1 pg (ref 26.0–34.0)
MCHC: 33.4 g/dL (ref 30.0–36.0)
MCV: 81.1 fL (ref 78.0–100.0)
Monocytes Absolute: 0.4 10*3/uL (ref 0.1–1.0)
Monocytes Relative: 7 % (ref 3–12)
NEUTROS PCT: 68 % (ref 43–77)
Neutro Abs: 4.1 10*3/uL (ref 1.7–7.7)
Platelets: 407 10*3/uL — ABNORMAL HIGH (ref 150–400)
RBC: 4.13 MIL/uL (ref 3.87–5.11)
RDW: 15.7 % — AB (ref 11.5–15.5)
WBC: 6 10*3/uL (ref 4.0–10.5)

## 2014-03-12 NOTE — Progress Notes (Signed)
Complete Physical  Assessment and Plan: HTN-- continue medications, DASH diet, exercise and monitor at home. Call if greater than 130/80.   Allergic rhinitis- Allegra OTC, increase H20, allergy hygiene explained.  Vitamin D deficiency- continue supplement  Anemia- gets labs, continue iron, seeing Ob/GYN  Pulmonary hypertension- RVSP 45- asymptomatic  Hypokalemia- check BMP, off HCTZ  Hypomagnesemia- check mag, off HCTZ  RLQ tenderness with new fixed palpable uterus and questionable RLQ mass- distinctly new from previous visits- will get [place] tomorrow  Discussed med's effects and SE's. Screening labs and tests as requested with regular follow-up as recommended.  HPI 43 y.o. female  presents for a complete physical. She has had elevated blood pressure for 7-8 years. Her blood pressure has been controlled at home, today their BP is BP: 138/82 mmHg Her last labs showed hypokalemia/hyponatremia so she was taken off the HCTZ and lisinopril was increased to 40mg  daily. Denies edema.  She does workout. She denies chest pain, shortness of breath, dizziness.  She is not on cholesterol medication and denies myalgias. Her cholesterol is at goal. The cholesterol last visit was:   Lab Results  Component Value Date   CHOL 146 06/15/2013   HDL 44 06/15/2013   LDLCALC 92 06/15/2013   TRIG 48 06/15/2013   CHOLHDL 3.3 06/15/2013   Last A1C in the office was:  Lab Results  Component Value Date   HGBA1C 5.0 06/15/2013   She has had iron def anemia, she was sent to GI and referred back to her OB/GYN, she has seen Dr. Carlean Purl and he believes that her anemia is likely from her heavy menses, she follows with her OB/GYN tomorrow at [facility]. She is complaining of right lower quadrant pain and flank pain for the past 2 weeks, intermittent, nothing makes it worse, aleve/tylenol does not help. Denies nausea, diarrhea, constipation. LMP was 02/26/2014. Pain started after that. Very heavy menses, she wears  both pads and tampons during the day, does 4 tampons in a day with bleeding through to the pad often,  lasts for 5 days. She complains of increased frequency, declines night sweats, decreased appetie, weight loss.  Patient is on Vitamin D supplement.   Lab Results  Component Value Date   VD25OH 35 12/19/2013     She works as a Glass blower/designer, is in a same sex monogamous relationship x20+ years and has 2 children, 22 finished college at [employer] and 16.   Current Medications:  Current Outpatient Prescriptions on File Prior to Visit  Medication Sig Dispense Refill  . lisinopril (PRINIVIL,ZESTRIL) 40 MG tablet Take 1 tablet (40 mg total) by mouth daily.  90 tablet  0   No current facility-administered medications on file prior to visit.   Health Maintenance:   Immunization History  Administered Date(s) Administered  . Pneumococcal-Unspecified 06/14/2001  . Td 06/14/2001  . Tdap 06/15/2013   Tetanus: 2015 Pneumovax: 2003 Flu vaccine: declines Zostavax: N/A Pap: 2012 see OB/GYN MGM: will get at OB/GYN DEXA: N/A Colonoscopy: N/A EGD: N/A Echo: 2010 PHTN 50, EF 65% DEE- unknown name 2013 q 2 years Dentist- Dr. Arrie Aran q 6 months  Allergies: No Known Allergies Medical History:  Past Medical History  Diagnosis Date  . Hypertension   . Allergy   . Vitamin D deficiency   . Anemia   . Pulmonary hypertension Via Echo 2010    EF 65% RVSP 45 (Dr. Stanford Breed)  . Hypokalemia   . Hypomagnesemia    Surgical History: No past surgical  history on file. Family History:  Family History  Problem Relation Age of Onset  . Hypertension Mother   . Hyperlipidemia Mother   . Hyperlipidemia Father   . Hypertension Father    Social History:  History  Substance Use Topics  . Smoking status: Never Smoker   . Smokeless tobacco: Never Used  . Alcohol Use: No   Review of Systems: see HPI  Physical Exam: Estimated body mass index is 24.76 kg/(m^2) as calculated from the following:   Height as of  this encounter: 5' 3.5" (1.613 m).   Weight as of this encounter: 142 lb (64.411 kg). BP 138/82  Pulse 88  Temp(Src) 98.1 F (36.7 C)  Resp 16  Ht 5' 3.5" (1.613 m)  Wt 142 lb (64.411 kg)  BMI 24.76 kg/m2  LMP 02/21/2014 General Appearance: Well nourished, in no apparent distress. Eyes: PERRLA, EOMs, conjunctiva no swelling or erythema, normal fundi and vessels. Sinuses: No Frontal/maxillary tenderness ENT/Mouth: Ext aud canals clear, normal light reflex with TMs without erythema, bulging.  Good dentition. No erythema, swelling, or exudate on post pharynx. Tonsils not swollen or erythematous. Hearing normal.  Neck: Supple, thyroid normal. No bruits Respiratory: Respiratory effort normal, BS equal bilaterally without rales, rhonchi, wheezing or stridor. Cardio: RRR without murmurs, questionable S3/S4 gallop. Brisk peripheral pulses without edema.  Chest: symmetric, with normal excursions and percussion. Breasts: defer Abdomen:  +BS. Uterus firm, fixed to the level above the naval, with questionable tender right lower quadrant mass. no guarding, rebound, hernias.  Lymphatics: Non tender without lymphadenopathy.  Genitourinary: defer Musculoskeletal: Full ROM all peripheral extremities,5/5 strength, and normal gait. Skin: Warm, dry without rashes, lesions, ecchymosis.  Neuro: Cranial nerves intact, reflexes equal bilaterally. Normal muscle tone, no cerebellar symptoms. Sensation intact.  Psych: Awake and oriented X 3, normal affect, Insight and Judgment appropriate.   EKG: WNL no changes.  Vicie Mutters 9:05 AM

## 2014-03-12 NOTE — Patient Instructions (Addendum)
Use a dropper to put olive oil or canola oil in the effected ear- 2-3 times a week. Let it soak for 20-30 min then you can take a shower or use a baby bulb with warm water to wash out the ear wax.  Do not use Qtips  Preventative Care for Adults - Female      MAINTAIN REGULAR HEALTH EXAMS:  A routine yearly physical is a good way to check in with your primary care provider about your health and preventive screening. It is also an opportunity to share updates about your health and any concerns you have, and receive a thorough all-over exam.   Most health insurance companies pay for at least some preventative services.  Check with your health plan for specific coverages.  WHAT PREVENTATIVE SERVICES DO WOMEN NEED?  Adult women should have their weight and blood pressure checked regularly.   Women age 71 and older should have their cholesterol levels checked regularly.  Women should be screened for cervical cancer with a Pap smear and pelvic exam beginning at either age 83, or 3 years after they become sexually activity.    Breast cancer screening generally begins at age 19 with a mammogram and breast exam by your primary care provider.    Beginning at age 37 and continuing to age 12, women should be screened for colorectal cancer.  Certain people may need continued testing until age 12.  Updating vaccinations is part of preventative care.  Vaccinations help protect against diseases such as the flu.  Osteoporosis is a disease in which the bones lose minerals and strength as we age. Women ages 72 and over should discuss this with their caregivers, as should women after menopause who have other risk factors.  Lab tests are generally done as part of preventative care to screen for anemia and blood disorders, to screen for problems with the kidneys and liver, to screen for bladder problems, to check blood sugar, and to check your cholesterol level.  Preventative services generally include  counseling about diet, exercise, avoiding tobacco, drugs, excessive alcohol consumption, and sexually transmitted infections.    GENERAL RECOMMENDATIONS FOR GOOD HEALTH:  Healthy diet:  Eat a variety of foods, including fruit, vegetables, animal or vegetable protein, such as meat, fish, chicken, and eggs, or beans, lentils, tofu, and grains, such as rice.  Drink plenty of water daily.  Decrease saturated fat in the diet, avoid lots of red meat, processed foods, sweets, fast foods, and fried foods.  Exercise:  Aerobic exercise helps maintain good heart health. At least 30-40 minutes of moderate-intensity exercise is recommended. For example, a brisk walk that increases your heart rate and breathing. This should be done on most days of the week.   Find a type of exercise or a variety of exercises that you enjoy so that it becomes a part of your daily life.  Examples are running, walking, swimming, water aerobics, and biking.  For motivation and support, explore group exercise such as aerobic class, spin class, Zumba, Yoga,or  martial arts, etc.    Set exercise goals for yourself, such as a certain weight goal, walk or run in a race such as a 5k walk/run.  Speak to your primary care provider about exercise goals.  Disease prevention:  If you smoke or chew tobacco, find out from your caregiver how to quit. It can literally save your life, no matter how long you have been a tobacco user. If you do not use tobacco, never begin.  Maintain a healthy diet and normal weight. Increased weight leads to problems with blood pressure and diabetes.   The Body Mass Index or BMI is a way of measuring how much of your body is fat. Having a BMI above 27 increases the risk of heart disease, diabetes, hypertension, stroke and other problems related to obesity. Your caregiver can help determine your BMI and based on it develop an exercise and dietary program to help you achieve or maintain this important  measurement at a healthful level.  High blood pressure causes heart and blood vessel problems.  Persistent high blood pressure should be treated with medicine if weight loss and exercise do not work.   Fat and cholesterol leaves deposits in your arteries that can block them. This causes heart disease and vessel disease elsewhere in your body.  If your cholesterol is found to be high, or if you have heart disease or certain other medical conditions, then you may need to have your cholesterol monitored frequently and be treated with medication.   Ask if you should have a cardiac stress test if your history suggests this. A stress test is a test done on a treadmill that looks for heart disease. This test can find disease prior to there being a problem.  Menopause can be associated with physical symptoms and risks. Hormone replacement therapy is available to decrease these. You should talk to your caregiver about whether starting or continuing to take hormones is right for you.   Osteoporosis is a disease in which the bones lose minerals and strength as we age. This can result in serious bone fractures. Risk of osteoporosis can be identified using a bone density scan. Women ages 68 and over should discuss this with their caregivers, as should women after menopause who have other risk factors. Ask your caregiver whether you should be taking a calcium supplement and Vitamin D, to reduce the rate of osteoporosis.   Avoid drinking alcohol in excess (more than two drinks per day).  Avoid use of street drugs. Do not share needles with anyone. Ask for professional help if you need assistance or instructions on stopping the use of alcohol, cigarettes, and/or drugs.  Brush your teeth twice a day with fluoride toothpaste, and floss once a day. Good oral hygiene prevents tooth decay and gum disease. The problems can be painful, unattractive, and can cause other health problems. Visit your dentist for a routine oral  and dental check up and preventive care every 6-12 months.   Look at your skin regularly.  Use a mirror to look at your back. Notify your caregivers of changes in moles, especially if there are changes in shapes, colors, a size larger than a pencil eraser, an irregular border, or development of new moles.  Safety:  Use seatbelts 100% of the time, whether driving or as a passenger.  Use safety devices such as hearing protection if you work in environments with loud noise or significant background noise.  Use safety glasses when doing any work that could send debris in to the eyes.  Use a helmet if you ride a bike or motorcycle.  Use appropriate safety gear for contact sports.  Talk to your caregiver about gun safety.  Use sunscreen with a SPF (or skin protection factor) of 15 or greater.  Lighter skinned people are at a greater risk of skin cancer. Don't forget to also wear sunglasses in order to protect your eyes from too much damaging sunlight. Damaging sunlight can accelerate cataract  formation.   Practice safe sex. Use condoms. Condoms are used for birth control and to help reduce the spread of sexually transmitted infections (or STIs).  Some of the STIs are gonorrhea (the clap), chlamydia, syphilis, trichomonas, herpes, HPV (human papilloma virus) and HIV (human immunodeficiency virus) which causes AIDS. The herpes, HIV and HPV are viral illnesses that have no cure. These can result in disability, cancer and death.   Keep carbon monoxide and smoke detectors in your home functioning at all times. Change the batteries every 6 months or use a model that plugs into the wall.   Vaccinations:  Stay up to date with your tetanus shots and other required immunizations. You should have a booster for tetanus every 10 years. Be sure to get your flu shot every year, since 5%-20% of the [place] population comes down with the flu. The flu vaccine changes each year, so being vaccinated once is not enough. Get your  shot in the fall, before the flu season peaks.   Other vaccines to consider:  Human Papilloma Virus or HPV causes cancer of the cervix, and other infections that can be transmitted from person to person. There is a vaccine for HPV, and females should get immunized between the ages of 43 and 50. It requires a series of 3 shots.   Pneumococcal vaccine to protect against certain types of pneumonia.  This is normally recommended for adults age 83 or older.  However, adults younger than 43 years old with certain underlying conditions such as diabetes, heart or lung disease should also receive the vaccine.  Shingles vaccine to protect against Varicella Zoster if you are older than age 7, or younger than 43 years old with certain underlying illness.  Hepatitis A vaccine to protect against a form of infection of the liver by a virus acquired from food.  Hepatitis B vaccine to protect against a form of infection of the liver by a virus acquired from blood or body fluids, particularly if you work in health care.  If you plan to travel internationally, check with your local health department for specific vaccination recommendations.  Cancer Screening:  Breast cancer screening is essential to preventive care for women. All women age 58 and older should perform a breast self-exam every month. At age 29 and older, women should have their caregiver complete a breast exam each year. Women at ages 76 and older should have a mammogram (x-ray film) of the breasts. Your caregiver can discuss how often you need mammograms.    Cervical cancer screening includes taking a Pap smear (sample of cells examined under a microscope) from the cervix (end of the uterus). It also includes testing for HPV (Human Papilloma Virus, which can cause cervical cancer). Screening and a pelvic exam should begin at age 61, or 3 years after a woman becomes sexually active. Screening should occur every year, with a Pap smear but no HPV  testing, up to age 23. After age 2, you should have a Pap smear every 3 years with HPV testing, if no HPV was found previously.   Most routine colon cancer screening begins at the age of 58. On a yearly basis, doctors may provide special easy to use take-home tests to check for hidden blood in the stool. Sigmoidoscopy or colonoscopy can detect the earliest forms of colon cancer and is life saving. These tests use a small camera at the end of a tube to directly examine the colon. Speak to your caregiver about this at  age 27, when routine screening begins (and is repeated every 5 years unless early forms of pre-cancerous polyps or small growths are found).

## 2014-03-13 LAB — HEMOGLOBIN A1C
Hgb A1c MFr Bld: 5.5 % (ref <5.7)
Mean Plasma Glucose: 111 mg/dL (ref <117)

## 2014-03-13 LAB — URINE CULTURE: Colony Count: 70000

## 2014-03-13 LAB — LIPID PANEL
CHOL/HDL RATIO: 3.1 ratio
CHOLESTEROL: 138 mg/dL (ref 0–200)
HDL: 44 mg/dL (ref >39)
LDL Cholesterol: 80 mg/dL (ref 0–99)
Triglycerides: 72 mg/dL (ref <150)
VLDL: 14 mg/dL (ref 0–40)

## 2014-03-13 LAB — IRON AND TIBC
%SAT: 9 % — ABNORMAL LOW (ref 20–55)
Iron: 37 ug/dL — ABNORMAL LOW (ref 42–145)
TIBC: 420 ug/dL (ref 250–470)
UIBC: 383 ug/dL (ref 125–400)

## 2014-03-13 LAB — HEPATIC FUNCTION PANEL
ALT: <8 U/L (ref 0–35)
AST: 12 U/L (ref 0–37)
Albumin: 4 g/dL (ref 3.5–5.2)
Alkaline Phosphatase: 78 U/L (ref 39–117)
BILIRUBIN DIRECT: 0.1 mg/dL (ref 0.0–0.3)
BILIRUBIN TOTAL: 0.4 mg/dL (ref 0.2–1.2)
Indirect Bilirubin: 0.3 mg/dL (ref 0.2–1.2)
Total Protein: 7.5 g/dL (ref 6.0–8.3)

## 2014-03-13 LAB — URINALYSIS, ROUTINE W REFLEX MICROSCOPIC
Bilirubin Urine: NEGATIVE
Glucose, UA: NEGATIVE mg/dL
HGB URINE DIPSTICK: NEGATIVE
Ketones, ur: NEGATIVE mg/dL
LEUKOCYTES UA: NEGATIVE
NITRITE: NEGATIVE
PH: 6 (ref 5.0–8.0)
Protein, ur: NEGATIVE mg/dL
SPECIFIC GRAVITY, URINE: 1.005 (ref 1.005–1.030)
Urobilinogen, UA: 0.2 mg/dL (ref 0.0–1.0)

## 2014-03-13 LAB — BASIC METABOLIC PANEL WITH GFR
BUN: 8 mg/dL (ref 6–23)
CALCIUM: 9.3 mg/dL (ref 8.4–10.5)
CHLORIDE: 102 meq/L (ref 96–112)
CO2: 25 mEq/L (ref 19–32)
Creat: 0.65 mg/dL (ref 0.50–1.10)
GFR, Est African American: >89 mL/min
Glucose, Bld: 87 mg/dL (ref 70–99)
Potassium: 4.2 mEq/L (ref 3.5–5.3)
SODIUM: 138 meq/L (ref 135–145)

## 2014-03-13 LAB — MICROALBUMIN / CREATININE URINE RATIO
Creatinine, Urine: 50.4 mg/dL
Microalb Creat Ratio: 21.8 mg/g (ref 0.0–30.0)
Microalb, Ur: 1.1 mg/dL (ref <2.0)

## 2014-03-13 LAB — VITAMIN D 25 HYDROXY (VIT D DEFICIENCY, FRACTURES): VIT D 25 HYDROXY: 35 ng/mL (ref 30–89)

## 2014-03-13 LAB — VITAMIN B12: VITAMIN B 12: 748 pg/mL (ref 211–911)

## 2014-03-13 LAB — MAGNESIUM: MAGNESIUM: 1.8 mg/dL (ref 1.5–2.5)

## 2014-03-13 LAB — TSH: TSH: 2.113 u[IU]/mL (ref 0.350–4.500)

## 2014-03-13 LAB — INSULIN, FASTING: Insulin fasting, serum: 4.2 u[IU]/mL (ref 2.0–19.6)

## 2014-03-13 LAB — FERRITIN: Ferritin: 6 ng/mL — ABNORMAL LOW (ref 10–291)

## 2014-03-14 ENCOUNTER — Other Ambulatory Visit: Payer: Self-pay | Admitting: Obstetrics and Gynecology

## 2014-03-14 ENCOUNTER — Other Ambulatory Visit (HOSPITAL_COMMUNITY): Payer: Self-pay | Admitting: Obstetrics and Gynecology

## 2014-03-14 DIAGNOSIS — N63 Unspecified lump in unspecified breast: Secondary | ICD-10-CM

## 2014-03-14 DIAGNOSIS — E041 Nontoxic single thyroid nodule: Secondary | ICD-10-CM

## 2014-03-21 ENCOUNTER — Ambulatory Visit
Admission: RE | Admit: 2014-03-21 | Discharge: 2014-03-21 | Disposition: A | Discharge status: Home / Self Care | Payer: 59 | Source: Ambulatory Visit | Attending: Obstetrics and Gynecology | Admitting: Obstetrics and Gynecology

## 2014-03-21 DIAGNOSIS — N63 Unspecified lump in unspecified breast: Secondary | ICD-10-CM

## 2014-03-23 ENCOUNTER — Ambulatory Visit (HOSPITAL_COMMUNITY)
Admission: RE | Admit: 2014-03-23 | Discharge: 2014-03-23 | Disposition: A | Discharge status: Home / Self Care | Payer: 59 | Source: Ambulatory Visit | Attending: Obstetrics and Gynecology | Admitting: Obstetrics and Gynecology

## 2014-03-23 DIAGNOSIS — E041 Nontoxic single thyroid nodule: Secondary | ICD-10-CM | POA: Insufficient documentation

## 2014-03-26 ENCOUNTER — Other Ambulatory Visit: Payer: Self-pay | Admitting: Obstetrics and Gynecology

## 2014-03-26 DIAGNOSIS — D259 Leiomyoma of uterus, unspecified: Secondary | ICD-10-CM

## 2014-04-05 ENCOUNTER — Ambulatory Visit
Admission: RE | Admit: 2014-04-05 | Discharge: 2014-04-05 | Disposition: A | Discharge status: Home / Self Care | Payer: 59 | Source: Ambulatory Visit | Attending: Obstetrics and Gynecology | Admitting: Obstetrics and Gynecology

## 2014-04-05 DIAGNOSIS — D259 Leiomyoma of uterus, unspecified: Secondary | ICD-10-CM

## 2014-04-05 NOTE — Consult Note (Signed)
Chief Complaint: Symptomatic uterine fibroids  Referring Physician(s): McComb,Tasmine Hipwell S  History of Present Illness: Dawn Pope is a 43 y.o. (G2, P2) female past medical history significant only for well-controlled hypertension who presents to the interventional radiology clinic for evaluation of symptomatic uterine fibroids.  She is unaccompanied and serves as her own historian.  Intra-office pelvic ultrasound performed 03/20/2014 demonstrates a markedly enlarged myomatous uterus with dominant fibroid measuring 10.6 cm in diameter.  The patient admits to a long history of menorrhagia though states that this has significantly worsened during the past 3 months. Previously she experienced menorrhagia requiring changing a pad and tampons every 3-4 hours, currently having to change absorbent devices every 2 hours and now with the passage of large clots. She states that her cycle has remained regular.  No intermittent bleeding or spotting.  She denies any perimenopausal symptoms. She is anemic, currently on iron supplementation. She has never received a blood transfusion.    The patient reports a transient episode of pelvic and right lower quadrant abdominal pain approximately 3 months ago though states this has since resolved. She admits to worsening urinary frequency during the past 3 months. She denies dysuria, hematuria or flank pain.  She is otherwise without significant bulk like symptoms.  She is undergone a recent Pap smear but has not had an endometrial biopsy.  The patient is not interested in any having any future children.  She is not interested in undergoing a surgical procedure at the present time.  Past Medical History  Diagnosis Date  . Hypertension   . Vitamin D deficiency   . Anemia   . Pulmonary hypertension Via Echo 2010    EF 65% RVSP 45 (Dr. Stanford Breed)  . Hypokalemia   . Hypomagnesemia     History reviewed. No pertinent past surgical history.  Allergies: Review of  patient's allergies indicates no known allergies.  Medications: Prior to Admission medications   Medication Sig Start Date End Date Taking? Authorizing Provider  calcium & magnesium carbonates (MYLANTA) 311-232 MG per tablet Take 1 tablet by mouth daily.   Yes Historical Provider, MD  Cholecalciferol (VITAMIN D-3) 5000 UNITS TABS Take 1 tablet by mouth daily.   Yes Historical Provider, MD  ferrous fumarate (HEMOCYTE - 106 MG FE) 325 (106 FE) MG TABS tablet Take 1 tablet by mouth.   Yes Historical Provider, MD  lisinopril (PRINIVIL,ZESTRIL) 40 MG tablet Take 1 tablet (40 mg total) by mouth daily. 12/20/13  Yes Vicie Mutters, PA-C    Family History  Problem Relation Age of Onset  . Hypertension Mother   . Hyperlipidemia Mother   . Hyperlipidemia Father   . Hypertension Father     History   Social History  . Marital Status: Single    Spouse Name: N/A    Number of Children: 2  . Years of Education: N/A   Occupational History  . Glass blower/designer    Social History Main Topics  . Smoking status: Never Smoker   . Smokeless tobacco: Never Used  . Alcohol Use: No  . Drug Use: No  . Sexual Activity: None   Other Topics Concern  . None   Social History Narrative    ECOG Status: 0 - Asymptomatic  Review of Systems: A 12 point ROS discussed and pertinent positives are indicated in the HPI above.  All other systems are negative.  Review of Systems  Constitutional: Negative.   HENT: Negative.   Respiratory: Negative.   Cardiovascular: Negative.  Gastrointestinal: Negative.   Genitourinary: Positive for urgency, frequency and pelvic pain. Negative for dysuria, flank pain, decreased urine volume and difficulty urinating.    Vital Signs: BP 152/88 mmHg  Pulse 95  Temp(Src) 97.3 F (36.3 C)  Resp 16  Ht 5' 3.5" (1.613 m)  Wt 140 lb (63.504 kg)  BMI 24.41 kg/m2  SpO2 100%  LMP 03/23/2014  Physical Exam  Constitutional: She appears well-developed and well-nourished.    HENT:  Head: Normocephalic and atraumatic.  Cardiovascular: Normal rate, regular rhythm and intact distal pulses.   Easily palpable R CFA and DP pulses  Pulmonary/Chest: Effort normal and breath sounds normal.  Abdominal: Bowel sounds are normal. She exhibits mass.  I am able to palpate the uterine fundus approximately 3 finger breaths above the umbilicus.  Skin: Skin is warm and dry.  Psychiatric: She has a normal mood and affect. Her behavior is normal.  Vitals reviewed.   Imaging:  Report from intra-office pelvic ultrasound performed 03/21/2014 reports an enlarged uterus measuring 21.2 x 14.9 x 18.3 cm with multiple intramural and subserosal fibroids, the largest measuring 10.6, 6.3, 6.4, 6.6 and 6.3 cm.  Labs:  CBC:  Recent Labs  06/15/13 0922 12/19/13 1714 01/22/14 1625 03/12/14 0946  WBC 4.1 6.7 5.9 6.0  HGB 9.6* 9.3* 10.6* 11.2*  HCT 29.4* 28.9* 32.8* 33.5*  PLT 324 340 269 407*    COAGS: No results for input(s): INR, APTT in the last 8760 hours.  BMP:  Recent Labs  06/15/13 0922 12/19/13 1714 01/22/14 1625 03/12/14 0946  NA 138 134* 139 138  K 3.8 3.4* 3.7 4.2  CL 103 97 105 102  CO2 26 29 24 25   GLUCOSE 95 83 102* 87  BUN 14 12 15 8   CALCIUM 9.2 9.7 9.3 9.3  CREATININE 0.67 0.68 0.68 0.65  GFRNONAA >89 >89 >89 >89  GFRAA >89 >89 >89 >89   LIVER FUNCTION TESTS:  Recent Labs  06/15/13 0922 12/19/13 1714 03/12/14 0946  BILITOT 0.2* 0.5 0.4  AST 13 17 12   ALT 8 10 <8  ALKPHOS 60 59 78  PROT 7.4 7.8 7.5  ALBUMIN 3.9 4.3 4.0   Pap smear: 03/13/2014 - Negative  Assessment and Plan:  This is a very pleasant 43 y.o. (G2, P2) female past medical history significant only for well-controlled hypertension who presents to the interventional radiology clinic for evaluation of symptomatic uterine fibroids.  Intra-office pelvic ultrasound performed 03/20/2014 demonstrates a markedly enlarged myomatous uterus with dominant fibroid measuring 10.6 cm  in diameter.  She admits to a long history of menorrhagia though states that this has significantly worsened during the past 3 months, currently having to change absorbent devices every 2 hours and now with the passage of large clots.  She is anemic on iron supplementation.  She reports a transient episode of pelvic and right lower quadrant abdominal pain possibly 3 months ago though states this has resolved, though does admit to worsening urinary frequency during the past 3 months.  She is otherwise without significant bulk symptoms.  She denies any perimenopausal symptoms.   Prolonged discussions were held with the patient regarding potential treatment options including but not limited to hysterectomy, myomectomy and continued conservative management, though given her worsening menorrhagia,she wishes to undergo an intervention but would like to avoid a surgical procedure.  As such, prolonged discussions were held with the patient regarding the benefits and risks (including but not limited to contrast and radiation exposure, bleeding, vessel injury, infection, nontarget  embolization) of uterine fibroid embolization.  Following this conversation, the patient wishes to pursue uterine fibroid embolization. As such, a contrast enhanced pelvic MRI will be obtained (note, this has all ready been ordered by referring physician, Dr. Radene Knee) to better define overall fibroid burden and location and to ensure fibroid enhancement.  A pap smear performed on 03/13/2014 and was negative. We will arrange for the patient to undergo endometrial biopsy.  Following the acquisition of the contrast enhanced pelvic MRI and endometrial biopsy, the patient will undergo the uterine fibroid embolization at [facility].  Note, she wished to have this procedure performed as soon as possible though I explained that we perfer to perform this procedure during the 1-2 weeks between her cycles. This procedure will entail an  overnight observation for continued monitoring as well as PCA usage.  Thank you for this interesting consult.  I greatly enjoyed meeting Dawn Pope and look forward to participating in her care.  Ronny Bacon, MD   I spent a total of 30 minutes face to face in clinical consultation, greater than 50% of which was counseling/coordinating care for symptomatic uterine fibroids  Signed: Sandi Mariscal 04/05/2014, 9:35 AM

## 2014-04-09 ENCOUNTER — Ambulatory Visit
Admission: RE | Admit: 2014-04-09 | Discharge: 2014-04-09 | Disposition: A | Discharge status: Home / Self Care | Payer: 59 | Source: Ambulatory Visit | Attending: Obstetrics and Gynecology | Admitting: Obstetrics and Gynecology

## 2014-04-09 ENCOUNTER — Other Ambulatory Visit: Payer: Self-pay | Admitting: Physician Assistant

## 2014-04-09 DIAGNOSIS — D259 Leiomyoma of uterus, unspecified: Secondary | ICD-10-CM

## 2014-04-09 MED ORDER — GADOBENATE DIMEGLUMINE 529 MG/ML IV SOLN
13.0000 mL | Freq: Once | INTRAVENOUS | Status: AC | PRN
  Administered 2014-04-09: 13 mL via INTRAVENOUS

## 2014-04-12 ENCOUNTER — Telehealth: Payer: Self-pay | Admitting: Emergency Medicine

## 2014-04-12 NOTE — Telephone Encounter (Signed)
CALLED PT TO MAKE HER AWARE THAT HER EBX WAS NEG. AND DR Pascal Lux REVIEWED HER MR AND EVERTHING IS GOOD TO GO FOR THE UFE.  PT IS VERY ANXIOUS AND WANTS DONE ASAP.   I WILL SEND FOR INS. AUTHO AND HAVE TIFFANY AT [facility] TO CONTACT HER DIRECTLY.

## 2014-04-13 ENCOUNTER — Other Ambulatory Visit: Payer: Self-pay | Admitting: Interventional Radiology

## 2014-04-13 DIAGNOSIS — D259 Leiomyoma of uterus, unspecified: Secondary | ICD-10-CM

## 2014-04-19 ENCOUNTER — Telehealth: Payer: Self-pay | Admitting: Emergency Medicine

## 2014-04-19 NOTE — Telephone Encounter (Signed)
CALLED PT TO LET HER KNOW NO AUTHO NEEDED FOR INS. I SEE SHE IS SCHEDULED FOR 04-27-14 AND SHE IS GOOD TO GO FOR PROCEDURE.

## 2014-04-24 ENCOUNTER — Other Ambulatory Visit: Payer: Self-pay | Admitting: Radiology

## 2014-04-27 ENCOUNTER — Observation Stay (HOSPITAL_COMMUNITY)
Admission: RE | Admit: 2014-04-27 | Discharge: 2014-04-28 | Disposition: A | Discharge status: Home / Self Care | Payer: 59 | Source: Ambulatory Visit | Attending: Interventional Radiology | Admitting: Interventional Radiology

## 2014-04-27 ENCOUNTER — Ambulatory Visit (HOSPITAL_COMMUNITY)
Admission: RE | Admit: 2014-04-27 | Discharge: 2014-04-27 | Disposition: A | Discharge status: Home / Self Care | Payer: 59 | Source: Ambulatory Visit | Attending: Interventional Radiology | Admitting: Interventional Radiology

## 2014-04-27 ENCOUNTER — Encounter (HOSPITAL_COMMUNITY): Payer: Self-pay

## 2014-04-27 VITALS — BP 144/89 | Temp 98.0°F | Resp 16 | Ht 63.0 in | Wt 140.0 lb

## 2014-04-27 DIAGNOSIS — R350 Frequency of micturition: Secondary | ICD-10-CM | POA: Insufficient documentation

## 2014-04-27 DIAGNOSIS — D259 Leiomyoma of uterus, unspecified: Secondary | ICD-10-CM | POA: Diagnosis present

## 2014-04-27 DIAGNOSIS — I272 Other secondary pulmonary hypertension: Secondary | ICD-10-CM | POA: Insufficient documentation

## 2014-04-27 DIAGNOSIS — I10 Essential (primary) hypertension: Secondary | ICD-10-CM | POA: Insufficient documentation

## 2014-04-27 DIAGNOSIS — D252 Subserosal leiomyoma of uterus: Secondary | ICD-10-CM | POA: Diagnosis not present

## 2014-04-27 DIAGNOSIS — N920 Excessive and frequent menstruation with regular cycle: Secondary | ICD-10-CM | POA: Diagnosis not present

## 2014-04-27 DIAGNOSIS — R102 Pelvic and perineal pain: Secondary | ICD-10-CM | POA: Diagnosis not present

## 2014-04-27 DIAGNOSIS — D649 Anemia, unspecified: Secondary | ICD-10-CM | POA: Insufficient documentation

## 2014-04-27 DIAGNOSIS — N852 Hypertrophy of uterus: Secondary | ICD-10-CM | POA: Diagnosis not present

## 2014-04-27 HISTORY — PX: UTERINE ARTERY EMBOLIZATION: SHX2629

## 2014-04-27 LAB — CBC WITH DIFFERENTIAL/PLATELET
BASOS ABS: 0 10*3/uL (ref 0.0–0.1)
BASOS PCT: 0 % (ref 0–1)
Eosinophils Absolute: 0.1 10*3/uL (ref 0.0–0.7)
Eosinophils Relative: 1 % (ref 0–5)
HCT: 34.6 % — ABNORMAL LOW (ref 36.0–46.0)
Hemoglobin: 11.4 g/dL — ABNORMAL LOW (ref 12.0–15.0)
Lymphocytes Relative: 31 % (ref 12–46)
Lymphs Abs: 1.5 10*3/uL (ref 0.7–4.0)
MCH: 28.1 pg (ref 26.0–34.0)
MCHC: 32.9 g/dL (ref 30.0–36.0)
MCV: 85.4 fL (ref 78.0–100.0)
MONOS PCT: 9 % (ref 3–12)
Monocytes Absolute: 0.4 10*3/uL (ref 0.1–1.0)
NEUTROS ABS: 3 10*3/uL (ref 1.7–7.7)
Neutrophils Relative %: 59 % (ref 43–77)
Platelets: 281 10*3/uL (ref 150–400)
RBC: 4.05 MIL/uL (ref 3.87–5.11)
RDW: 13.2 % (ref 11.5–15.5)
WBC: 5 10*3/uL (ref 4.0–10.5)

## 2014-04-27 LAB — BASIC METABOLIC PANEL
ANION GAP: 15 (ref 5–15)
BUN: 12 mg/dL (ref 6–23)
CHLORIDE: 98 meq/L (ref 96–112)
CO2: 24 mEq/L (ref 19–32)
CREATININE: 0.7 mg/dL (ref 0.50–1.10)
Calcium: 9.5 mg/dL (ref 8.4–10.5)
GFR calc non Af Amer: >90 mL/min (ref >90)
Glucose, Bld: 100 mg/dL — ABNORMAL HIGH (ref 70–99)
Potassium: 3.3 mEq/L — ABNORMAL LOW (ref 3.7–5.3)
SODIUM: 137 meq/L (ref 137–147)

## 2014-04-27 LAB — PROTIME-INR
INR: 1.01 (ref 0.00–1.49)
PROTHROMBIN TIME: 13.4 s (ref 11.6–15.2)

## 2014-04-27 LAB — HCG, SERUM, QUALITATIVE: Preg, Serum: NEGATIVE

## 2014-04-27 MED ORDER — DIPHENHYDRAMINE HCL 50 MG/ML IJ SOLN: 12.5000 mg | Freq: Four times a day (QID) | INTRAMUSCULAR | Status: DC | PRN

## 2014-04-27 MED ORDER — IOHEXOL 300 MG/ML  SOLN
80.0000 mL | Freq: Once | INTRAMUSCULAR | Status: AC | PRN
  Administered 2014-04-27: 80 mL via INTRA_ARTERIAL

## 2014-04-27 MED ORDER — SODIUM CHLORIDE 0.9 % IJ SOLN
3.0000 mL | INTRAMUSCULAR | Status: DC | PRN
Start: 2014-04-27 — End: 2014-04-28

## 2014-04-27 MED ORDER — NALOXONE HCL 0.4 MG/ML IJ SOLN: 0.4000 mg | INTRAMUSCULAR | Status: DC | PRN

## 2014-04-27 MED ORDER — DIPHENHYDRAMINE HCL 12.5 MG/5ML PO ELIX: 12.5000 mg | ORAL_SOLUTION | Freq: Four times a day (QID) | ORAL | Status: DC | PRN

## 2014-04-27 MED ORDER — FENTANYL 10 MCG/ML IV SOLN
INTRAVENOUS | Status: DC
  Administered 2014-04-27: 395 ug via INTRAVENOUS

## 2014-04-27 MED ORDER — ONDANSETRON HCL 4 MG/2ML IJ SOLN: 4.0000 mg | Freq: Four times a day (QID) | INTRAMUSCULAR | Status: DC | PRN

## 2014-04-27 MED ORDER — NALOXONE HCL 0.4 MG/ML IJ SOLN
0.4000 mg | INTRAMUSCULAR | Status: DC | PRN
Start: 2014-04-27 — End: 2014-04-27

## 2014-04-27 MED ORDER — SODIUM CHLORIDE 0.9 % IV SOLN
INTRAVENOUS | Status: DC
  Administered 2014-04-27: 11:00:00 via INTRAVENOUS

## 2014-04-27 MED ORDER — FENTANYL 10 MCG/ML IV SOLN
INTRAVENOUS | Status: DC
  Filled 2014-04-27: qty 50

## 2014-04-27 MED ORDER — PROMETHAZINE HCL 25 MG RE SUPP
25.0000 mg | Freq: Three times a day (TID) | RECTAL | Status: DC | PRN
  Filled 2014-04-27: qty 1

## 2014-04-27 MED ORDER — SODIUM CHLORIDE 0.9 % IJ SOLN: 9.0000 mL | INTRAMUSCULAR | Status: DC | PRN

## 2014-04-27 MED ORDER — SODIUM CHLORIDE 0.9 % IV SOLN: 250.0000 mL | INTRAVENOUS | Status: DC | PRN

## 2014-04-27 MED ORDER — PROMETHAZINE HCL 25 MG PO TABS: 25.0000 mg | ORAL_TABLET | Freq: Three times a day (TID) | ORAL | Status: DC | PRN

## 2014-04-27 MED ORDER — PROMETHAZINE HCL 25 MG RE SUPP: 25.0000 mg | Freq: Three times a day (TID) | RECTAL | Status: DC | PRN

## 2014-04-27 MED ORDER — DOCUSATE SODIUM 100 MG PO CAPS
100.0000 mg | ORAL_CAPSULE | Freq: Two times a day (BID) | ORAL | Status: DC
  Filled 2014-04-27 (×3): qty 1

## 2014-04-27 MED ORDER — FENTANYL CITRATE 0.05 MG/ML IJ SOLN
INTRAMUSCULAR | Status: AC | PRN
  Administered 2014-04-27 (×2): 50 ug via INTRAVENOUS
  Administered 2014-04-27: 100 ug via INTRAVENOUS
  Administered 2014-04-27 (×2): 50 ug via INTRAVENOUS
  Administered 2014-04-27: 100 ug via INTRAVENOUS

## 2014-04-27 MED ORDER — CEFAZOLIN SODIUM-DEXTROSE 2-3 GM-% IV SOLR
INTRAVENOUS | Status: AC
  Filled 2014-04-27: qty 50

## 2014-04-27 MED ORDER — DIPHENHYDRAMINE HCL 12.5 MG/5ML PO ELIX
12.5000 mg | ORAL_SOLUTION | Freq: Four times a day (QID) | ORAL | Status: DC | PRN
  Filled 2014-04-27: qty 5

## 2014-04-27 MED ORDER — DOCUSATE SODIUM 100 MG PO CAPS
100.0000 mg | ORAL_CAPSULE | Freq: Two times a day (BID) | ORAL | Status: DC
  Administered 2014-04-27: 100 mg via ORAL
  Filled 2014-04-27 (×3): qty 1

## 2014-04-27 MED ORDER — MIDAZOLAM HCL 2 MG/2ML IJ SOLN
INTRAMUSCULAR | Status: AC | PRN
  Administered 2014-04-27: 1 mg via INTRAVENOUS
  Administered 2014-04-27: 2 mg via INTRAVENOUS
  Administered 2014-04-27 (×3): 1 mg via INTRAVENOUS

## 2014-04-27 MED ORDER — FENTANYL 10 MCG/ML IV SOLN
INTRAVENOUS | Status: DC
  Administered 2014-04-27: 15:00:00 via INTRAVENOUS
  Filled 2014-04-27: qty 50

## 2014-04-27 MED ORDER — LIDOCAINE HCL 1 % IJ SOLN
INTRAMUSCULAR | Status: AC
  Filled 2014-04-27: qty 20

## 2014-04-27 MED ORDER — KETOROLAC TROMETHAMINE 30 MG/ML IJ SOLN
INTRAMUSCULAR | Status: AC
  Filled 2014-04-27: qty 1

## 2014-04-27 MED ORDER — SODIUM CHLORIDE 0.9 % IJ SOLN: 3.0000 mL | Freq: Two times a day (BID) | INTRAMUSCULAR | Status: DC

## 2014-04-27 MED ORDER — FERROUS FUMARATE 325 (106 FE) MG PO TABS
1.0000 | ORAL_TABLET | Freq: Every evening | ORAL | Status: DC
  Filled 2014-04-27 (×2): qty 1

## 2014-04-27 MED ORDER — MIDAZOLAM HCL 2 MG/2ML IJ SOLN
INTRAMUSCULAR | Status: AC
  Filled 2014-04-27: qty 8

## 2014-04-27 MED ORDER — KETOROLAC TROMETHAMINE 30 MG/ML IJ SOLN
30.0000 mg | Freq: Once | INTRAMUSCULAR | Status: AC
  Administered 2014-04-27: 30 mg via INTRAVENOUS

## 2014-04-27 MED ORDER — FENTANYL 10 MCG/ML IV SOLN
INTRAVENOUS | Status: DC
  Administered 2014-04-27: 70 ug via INTRAVENOUS
  Filled 2014-04-27: qty 50

## 2014-04-27 MED ORDER — FENTANYL 10 MCG/ML IV SOLN
INTRAVENOUS | Status: DC
  Administered 2014-04-27: 22:00:00 via INTRAVENOUS
  Administered 2014-04-27: 268 ug via INTRAVENOUS
  Administered 2014-04-28: 225 ug via INTRAVENOUS
  Administered 2014-04-28: 468 ug via INTRAVENOUS
  Administered 2014-04-28: 05:00:00 via INTRAVENOUS
  Filled 2014-04-27 (×2): qty 50

## 2014-04-27 MED ORDER — SODIUM CHLORIDE 0.9 % IJ SOLN
3.0000 mL | Freq: Two times a day (BID) | INTRAMUSCULAR | Status: DC
  Administered 2014-04-28: 3 mL via INTRAVENOUS

## 2014-04-27 MED ORDER — ONDANSETRON HCL 4 MG/2ML IJ SOLN
4.0000 mg | Freq: Four times a day (QID) | INTRAMUSCULAR | Status: DC | PRN
  Administered 2014-04-28: 4 mg via INTRAVENOUS
  Filled 2014-04-27: qty 2

## 2014-04-27 MED ORDER — SODIUM CHLORIDE 0.9 % IJ SOLN: 3.0000 mL | INTRAMUSCULAR | Status: DC | PRN

## 2014-04-27 MED ORDER — FENTANYL CITRATE 0.05 MG/ML IJ SOLN
INTRAMUSCULAR | Status: AC
  Filled 2014-04-27: qty 8

## 2014-04-27 MED ORDER — LISINOPRIL 40 MG PO TABS
40.0000 mg | ORAL_TABLET | Freq: Every morning | ORAL | Status: DC
  Filled 2014-04-27: qty 1

## 2014-04-27 MED ORDER — CEFAZOLIN SODIUM-DEXTROSE 2-3 GM-% IV SOLR
2.0000 g | Freq: Once | INTRAVENOUS | Status: AC
  Administered 2014-04-27: 2 g via INTRAVENOUS

## 2014-04-27 NOTE — H&P (Signed)
Chief Complaint: Symptomatic uterine fibroids  Referring Physician(s): Dr. Radene Knee  History of Present Illness: Dawn Pope is a 43 y.o. female with history of symptomatic uterine fibroids, manifested by anemia, menorrhagia, occ pelvic pain and urinary frequency. She presents today following IR consultation for elective bilateral uterine artery embolization.  Past Medical History  Diagnosis Date  . Hypertension   . Vitamin D deficiency   . Anemia   . Pulmonary hypertension Via Echo 2010    EF 65% RVSP 45 (Dr. Stanford Breed)  . Hypokalemia   . Hypomagnesemia     Past Surgical History  Procedure Laterality Date  . No past surgeries      Allergies: Review of patient's allergies indicates no known allergies.  Medications: Prior to Admission medications   Medication Sig Start Date End Date Taking? Authorizing Provider  calcium & magnesium carbonates (MYLANTA) 311-232 MG per tablet Take 2 tablets by mouth every evening.     Historical Provider, MD  Cholecalciferol (VITAMIN D-3) 5000 UNITS TABS Take 1 tablet by mouth every evening.     Historical Provider, MD  ferrous fumarate (HEMOCYTE - 106 MG FE) 325 (106 FE) MG TABS tablet Take 1 tablet by mouth every evening.     Historical Provider, MD  ibuprofen (ADVIL,MOTRIN) 200 MG tablet Take 400 mg by mouth every 6 (six) hours as needed for headache or cramping.    Historical Provider, MD  lisinopril (PRINIVIL,ZESTRIL) 40 MG tablet TAKE ONE TABLET BY MOUTH ONCE DAILY Patient not taking: Reported on 04/24/2014 04/09/14   Unk Pinto, MD  lisinopril (PRINIVIL,ZESTRIL) 40 MG tablet Take 40 mg by mouth every morning.    Historical Provider, MD    Family History  Problem Relation Age of Onset  . Hypertension Mother   . Hyperlipidemia Mother   . Hyperlipidemia Father   . Hypertension Father     History   Social History  . Marital Status: Single    Spouse Name: N/A    Number of Children: 2  . Years of Education: N/A    Occupational History  . Glass blower/designer    Social History Main Topics  . Smoking status: Never Smoker   . Smokeless tobacco: Never Used  . Alcohol Use: No  . Drug Use: No  . Sexual Activity: Not on file   Other Topics Concern  . Not on file   Social History Narrative         Review of Systems  Constitutional: Negative for fever and chills.  Respiratory: Negative for cough and shortness of breath.   Cardiovascular: Negative for chest pain.  Gastrointestinal: Negative for nausea, vomiting and blood in stool.       Occ pelvic pain  Genitourinary: Positive for frequency. Negative for dysuria and hematuria.       Menorrhagia  Musculoskeletal: Negative for back pain.  Neurological: Negative for headaches.  Hematological: Does not bruise/bleed easily.    Vital Signs: LMP 04/13/2014  BP 144/89  HR 90 TEMP 98  O2 SATS 100% RA  R 16  Physical Exam  Constitutional: She is oriented to person, place, and time. She appears well-developed and well-nourished.  Cardiovascular: Normal rate, regular rhythm and intact distal pulses.   Pulmonary/Chest: Effort normal and breath sounds normal.  Abdominal: Soft. Bowel sounds are normal. There is tenderness.  Fibroid uterus  Musculoskeletal: Normal range of motion. She exhibits no edema.  Neurological: She is alert and oriented to person, place, and time.  Skin: Skin is warm and  dry.    Imaging: Mr Pelvis W Wo Contrast  04/09/2014   CLINICAL DATA:  Fibroids. Preop evaluation for uterine artery embolization.  EXAM: MRI PELVIS WITHOUT AND WITH CONTRAST  TECHNIQUE: Multiplanar multisequence MR imaging of the pelvis was performed both before and after administration of intravenous contrast.  CONTRAST:  35mL MULTIHANCE GADOBENATE DIMEGLUMINE 529 MG/ML IV SOLN  COMPARISON:  None.  FINDINGS: Uterus: Measures 20.2 by 9.9 x 18.7 cm. Estimated volume = 1,956 cc  Fibroids: Innumerable fibroids are seen involving the uterus diffusely, with largest  fibroid in the left anterior corpus measuring 8.6 cm in maximum diameter.  Intracavitary fibroids:  None.  Pedunculated fibroids: A 6.9 cm fibroid is seen projecting from the right lateral upper uterine corpus, with a broad-based attachment measuring approximately 4 cm in thickness.  Fibroid contrast enhancement: All fibroids show diffuse contrast enhancement, except for a single 6.5 cm degenerated fibroid in the right uterine fundus.  Cervix:  Normal appearance.  Right ovary:  Normal appearance.  No adnexal mass identified.  Left ovary:  Normal appearance.  No adnexal mass identified.  Free fluid:  Tiny amount of free fluid in pelvic cul-de-sac  Non-GYN findings: No other pelvic masses, adenopathy, or inflammatory process identified.  IMPRESSION: Markedly enlarged uterus with numerous fibroids, as described above. 7 cm pedunculated subserosal fibroid seen along the right lateral corpus, which shows a broad-based attachment measuring approximately 4 cm in thickness. No intracavitary fibroids identified.  Normal appearance of both ovaries.  No adnexal mass identified.   Electronically Signed   By: Earle Gell M.D.   On: 04/09/2014 13:54    Labs:  CBC:  Recent Labs  12/19/13 1714 01/22/14 1625 03/12/14 0946 04/27/14 1045  WBC 6.7 5.9 6.0 5.0  HGB 9.3* 10.6* 11.2* 11.4*  HCT 28.9* 32.8* 33.5* 34.6*  PLT 340 269 407* 281    COAGS:  Recent Labs  04/27/14 1045  INR 1.01    BMP:  Recent Labs  12/19/13 1714 01/22/14 1625 03/12/14 0946 04/27/14 1045  NA 134* 139 138 137  K 3.4* 3.7 4.2 3.3*  CL 97 105 102 98  CO2 29 24 25 24   GLUCOSE 83 102* 87 100*  BUN 12 15 8 12   CALCIUM 9.7 9.3 9.3 9.5  CREATININE 0.68 0.68 0.65 0.70  GFRNONAA >89 >89 >89 >90  GFRAA >89 >89 >89 >90    LIVER FUNCTION TESTS:  Recent Labs  06/15/13 0922 12/19/13 1714 03/12/14 0946  BILITOT 0.2* 0.5 0.4  AST 13 17 12   ALT 8 10 <8  ALKPHOS 60 59 78  PROT 7.4 7.8 7.5  ALBUMIN 3.9 4.3 4.0    TUMOR  MARKERS: No results for input(s): AFPTM, CEA, CA199, CHROMGRNA in the last 8760 hours.  Assessment and Plan: Dawn Pope is a 43 y.o. female with history of symptomatic uterine fibroids, manifested by anemia, menorrhagia, occ pelvic pain and urinary frequency. She presents today following IR consultation for elective bilateral uterine artery embolization. Details/risks of procedure d/w pt with her understanding and consent. Following procedure she will be admitted for overnight observation.       I spent a total of 30 minutes face to face in clinical consultation, greater than 50% of which was counseling/coordinating care for uterine fibroid embolization.  Signed: Autumn Messing 04/27/2014, 11:34 AM

## 2014-04-27 NOTE — Procedures (Signed)
Post UFE.  No immediate complications.  Keep right leg straight for 4 hrs.   

## 2014-04-27 NOTE — Progress Notes (Addendum)
Pam Georjean Toya RN and Kyra Leyland RN attempted to place foley catheter twice. Unable to place catheter into urethra. Two separate foley catheters used for each attempt. Two RNs present and protocol followed.  46  Dr Pascal Lux made aware foley catheter placement is unsuccessful.

## 2014-04-27 NOTE — Progress Notes (Signed)
Patient ID: Dawn Pope, female   DOB: 02/09/1971, 43 y.o.   MRN: [id number]   Referring Physician(s): Dr. Radene Knee  Subjective:  Pt c/o pelvic cramping, intermittent nausea  Allergies: Review of patient's allergies indicates no known allergies.  Medications: Prior to Admission medications   Medication Sig Start Date End Date Taking? Authorizing Provider  calcium & magnesium carbonates (MYLANTA) 311-232 MG per tablet Take 2 tablets by mouth every evening.     Historical Provider, MD  Cholecalciferol (VITAMIN D-3) 5000 UNITS TABS Take 1 tablet by mouth every evening.     Historical Provider, MD  ferrous fumarate (HEMOCYTE - 106 MG FE) 325 (106 FE) MG TABS tablet Take 1 tablet by mouth every evening.     Historical Provider, MD  ibuprofen (ADVIL,MOTRIN) 200 MG tablet Take 400 mg by mouth every 6 (six) hours as needed for headache or cramping.    Historical Provider, MD  lisinopril (PRINIVIL,ZESTRIL) 40 MG tablet TAKE ONE TABLET BY MOUTH ONCE DAILY Patient not taking: Reported on 04/24/2014 04/09/14   Unk Pinto, MD  lisinopril (PRINIVIL,ZESTRIL) 40 MG tablet Take 40 mg by mouth every morning.    Historical Provider, MD    Review of Systems see above  Vital Signs: BP 125/74 mmHg  Pulse 82  Temp(Src) 97.4 F (36.3 C)  Resp 11  SpO2 100%  LMP 04/13/2014  Physical Exam pt awake but drowsy; abd soft, fibroid uterus with mild- mod tenderness to palpation; puncture site rt CFA clean and dry,NT, no hematoma  Imaging: Ir Angiogram Pelvis Selective Or Supraselective  04/27/2014   INDICATION: Symptomatic uterine fibroids. Patient initially seen in consultation on 04/05/2014 and has elected to undergo uterine fibroid embolization secondary to extensive menorrhagia.  EXAM: Uterine Fibroid Embolization  COMPARISON:  Contrast enhanced pelvic MRI - 04/09/2014  MEDICATIONS: Fentanyl 400 mcg IV; Versed 6 mg IV; Toradol 30 mg IV: Ancef 2 gm IV; The antibiotic was administered within one hour of  the procedure  ANESTHESIA/SEDATION: Sedation time  120 minutes  CONTRAST:  56mL OMNIPAQUE IOHEXOL 300 MG/ML  SOLN  FLUOROSCOPY TIME:  33 minutes; 54 seconds.  (3,818 mGy)  COMPLICATIONS: None immediate  PROCEDURE: Informed consent was obtained from the patient following explanation of the procedure, risks, benefits and alternatives. The patient understands, agrees and consents for the procedure. All questions were addressed. A time out was performed prior to the initiation of the procedure. Maximal barrier sterile technique utilized including caps, mask, sterile gowns, sterile gloves, large sterile drape, hand hygiene, and Betadine prep.  The right femoral head was marked fluoroscopically. Under sterile conditions and local anesthesia, the right common femoral artery access was performed with a micropuncture needle. Under direct ultrasound guidance, the right common femoral was accessed with a micropuncture kit. An ultrasound image was saved for documentation purposes. This allowed for placement of a 5-French vascular sheath. A limited arteriogram was performed through the side arm of the sheath confirming appropriate access within the right common femoral artery.  A C2 catheter was utilized to select the contralateral left internal iliac artery. Selective left internal iliac angiogram was performed. The tortuous left uterine artery was identified. Selective catheterization was performed of the left uterine artery with a microcatheter and micro guide wire. A selective left uterine angiogram was performed. This demonstrated patency of the left uterine artery. Mild diffuse hypervascularity of the enlarged fibroid uterus. Access was adequate for embolization. For embolization, 3 vials of 500 - 700 micron and 1 vial of 700 - 900 Embospheres  were injected into the left uterine artery. Post embolization angiogram confirms complete stasis of the left uterine vascular territory. Microcatheter was removed.  The C2 catheter  was retracted and utilized to select the ipsilateral right internal iliac artery. Selective right internal iliac angiogram was performed. The patent right uterine artery was identified. For selective catheterization, the micro catheter and guidewire were utilized to select the right uterine artery. Selective right uterine angiogram was performed. This demonstrated patency of the right uterine artery. Catheter position was safe for embolization. Embolization was performed to complete stasis with injection of 3 vials of 500-700 micron and 3 vials of 700 - 900 Embospheres. Post embolization angiogram confirms complete stasis of the right uterine vascular territory.  At this point, all wires, catheters and sheaths were removed from the patient. Hemostasis was achieved at the right groin access site with  The patient tolerated the procedure well without immediate post procedural complication.  FINDINGS: Left internal iliac arteriogram demonstrates expected hypertrophy of the tortuous left uterine artery supplying an enlarged and myomatous uterus. Post embolization angiogram confirms complete stasis of the left uterine vascular territory.  Right internal iliac arteriogram demonstrates expected hypertrophy of the tortuous right uterine artery supplying an enlarged and myomatous uterus. Post embolization angiogram confirms complete stasis of the right uterine vascular territory.  IMPRESSION: Technically successful bilateral uterine artery embolization (U F E).  PLAN: The patient will be seen in follow-up consultation in approximately 1 month. Note, no imaging is necessary for this initial follow-up consultation.   Electronically Signed   By: Sandi Mariscal M.D.   On: 04/27/2014 15:35   Ir Angiogram Pelvis Selective Or Supraselective  04/27/2014   INDICATION: Symptomatic uterine fibroids. Patient initially seen in consultation on 04/05/2014 and has elected to undergo uterine fibroid embolization secondary to extensive  menorrhagia.  EXAM: Uterine Fibroid Embolization  COMPARISON:  Contrast enhanced pelvic MRI - 04/09/2014  MEDICATIONS: Fentanyl 400 mcg IV; Versed 6 mg IV; Toradol 30 mg IV: Ancef 2 gm IV; The antibiotic was administered within one hour of the procedure  ANESTHESIA/SEDATION: Sedation time  120 minutes  CONTRAST:  32m OMNIPAQUE IOHEXOL 300 MG/ML  SOLN  FLUOROSCOPY TIME:  33 minutes; 54 seconds.  (27,078mGy)  COMPLICATIONS: None immediate  PROCEDURE: Informed consent was obtained from the patient following explanation of the procedure, risks, benefits and alternatives. The patient understands, agrees and consents for the procedure. All questions were addressed. A time out was performed prior to the initiation of the procedure. Maximal barrier sterile technique utilized including caps, mask, sterile gowns, sterile gloves, large sterile drape, hand hygiene, and Betadine prep.  The right femoral head was marked fluoroscopically. Under sterile conditions and local anesthesia, the right common femoral artery access was performed with a micropuncture needle. Under direct ultrasound guidance, the right common femoral was accessed with a micropuncture kit. An ultrasound image was saved for documentation purposes. This allowed for placement of a 5-French vascular sheath. A limited arteriogram was performed through the side arm of the sheath confirming appropriate access within the right common femoral artery.  A C2 catheter was utilized to select the contralateral left internal iliac artery. Selective left internal iliac angiogram was performed. The tortuous left uterine artery was identified. Selective catheterization was performed of the left uterine artery with a microcatheter and micro guide wire. A selective left uterine angiogram was performed. This demonstrated patency of the left uterine artery. Mild diffuse hypervascularity of the enlarged fibroid uterus. Access was adequate for embolization. For embolization,  3  vials of 500 - 700 micron and 1 vial of 700 - 900 Embospheres were injected into the left uterine artery. Post embolization angiogram confirms complete stasis of the left uterine vascular territory. Microcatheter was removed.  The C2 catheter was retracted and utilized to select the ipsilateral right internal iliac artery. Selective right internal iliac angiogram was performed. The patent right uterine artery was identified. For selective catheterization, the micro catheter and guidewire were utilized to select the right uterine artery. Selective right uterine angiogram was performed. This demonstrated patency of the right uterine artery. Catheter position was safe for embolization. Embolization was performed to complete stasis with injection of 3 vials of 500-700 micron and 3 vials of 700 - 900 Embospheres. Post embolization angiogram confirms complete stasis of the right uterine vascular territory.  At this point, all wires, catheters and sheaths were removed from the patient. Hemostasis was achieved at the right groin access site with  The patient tolerated the procedure well without immediate post procedural complication.  FINDINGS: Left internal iliac arteriogram demonstrates expected hypertrophy of the tortuous left uterine artery supplying an enlarged and myomatous uterus. Post embolization angiogram confirms complete stasis of the left uterine vascular territory.  Right internal iliac arteriogram demonstrates expected hypertrophy of the tortuous right uterine artery supplying an enlarged and myomatous uterus. Post embolization angiogram confirms complete stasis of the right uterine vascular territory.  IMPRESSION: Technically successful bilateral uterine artery embolization (U F E).  PLAN: The patient will be seen in follow-up consultation in approximately 1 month. Note, no imaging is necessary for this initial follow-up consultation.   Electronically Signed   By: Sandi Mariscal M.D.   On: 04/27/2014 15:35    Ir Angiogram Selective Each Additional Vessel  04/27/2014   INDICATION: Symptomatic uterine fibroids. Patient initially seen in consultation on 04/05/2014 and has elected to undergo uterine fibroid embolization secondary to extensive menorrhagia.  EXAM: Uterine Fibroid Embolization  COMPARISON:  Contrast enhanced pelvic MRI - 04/09/2014  MEDICATIONS: Fentanyl 400 mcg IV; Versed 6 mg IV; Toradol 30 mg IV: Ancef 2 gm IV; The antibiotic was administered within one hour of the procedure  ANESTHESIA/SEDATION: Sedation time  120 minutes  CONTRAST:  30m OMNIPAQUE IOHEXOL 300 MG/ML  SOLN  FLUOROSCOPY TIME:  33 minutes; 54 seconds.  (25,726mGy)  COMPLICATIONS: None immediate  PROCEDURE: Informed consent was obtained from the patient following explanation of the procedure, risks, benefits and alternatives. The patient understands, agrees and consents for the procedure. All questions were addressed. A time out was performed prior to the initiation of the procedure. Maximal barrier sterile technique utilized including caps, mask, sterile gowns, sterile gloves, large sterile drape, hand hygiene, and Betadine prep.  The right femoral head was marked fluoroscopically. Under sterile conditions and local anesthesia, the right common femoral artery access was performed with a micropuncture needle. Under direct ultrasound guidance, the right common femoral was accessed with a micropuncture kit. An ultrasound image was saved for documentation purposes. This allowed for placement of a 5-French vascular sheath. A limited arteriogram was performed through the side arm of the sheath confirming appropriate access within the right common femoral artery.  A C2 catheter was utilized to select the contralateral left internal iliac artery. Selective left internal iliac angiogram was performed. The tortuous left uterine artery was identified. Selective catheterization was performed of the left uterine artery with a microcatheter and micro  guide wire. A selective left uterine angiogram was performed. This demonstrated patency of the left uterine  artery. Mild diffuse hypervascularity of the enlarged fibroid uterus. Access was adequate for embolization. For embolization, 3 vials of 500 - 700 micron and 1 vial of 700 - 900 Embospheres were injected into the left uterine artery. Post embolization angiogram confirms complete stasis of the left uterine vascular territory. Microcatheter was removed.  The C2 catheter was retracted and utilized to select the ipsilateral right internal iliac artery. Selective right internal iliac angiogram was performed. The patent right uterine artery was identified. For selective catheterization, the micro catheter and guidewire were utilized to select the right uterine artery. Selective right uterine angiogram was performed. This demonstrated patency of the right uterine artery. Catheter position was safe for embolization. Embolization was performed to complete stasis with injection of 3 vials of 500-700 micron and 3 vials of 700 - 900 Embospheres. Post embolization angiogram confirms complete stasis of the right uterine vascular territory.  At this point, all wires, catheters and sheaths were removed from the patient. Hemostasis was achieved at the right groin access site with  The patient tolerated the procedure well without immediate post procedural complication.  FINDINGS: Left internal iliac arteriogram demonstrates expected hypertrophy of the tortuous left uterine artery supplying an enlarged and myomatous uterus. Post embolization angiogram confirms complete stasis of the left uterine vascular territory.  Right internal iliac arteriogram demonstrates expected hypertrophy of the tortuous right uterine artery supplying an enlarged and myomatous uterus. Post embolization angiogram confirms complete stasis of the right uterine vascular territory.  IMPRESSION: Technically successful bilateral uterine artery embolization (U  F E).  PLAN: The patient will be seen in follow-up consultation in approximately 1 month. Note, no imaging is necessary for this initial follow-up consultation.   Electronically Signed   By: Simonne Come M.D.   On: 04/27/2014 15:35   Ir Angiogram Selective Each Additional Vessel  04/27/2014   INDICATION: Symptomatic uterine fibroids. Patient initially seen in consultation on 04/05/2014 and has elected to undergo uterine fibroid embolization secondary to extensive menorrhagia.  EXAM: Uterine Fibroid Embolization  COMPARISON:  Contrast enhanced pelvic MRI - 04/09/2014  MEDICATIONS: Fentanyl 400 mcg IV; Versed 6 mg IV; Toradol 30 mg IV: Ancef 2 gm IV; The antibiotic was administered within one hour of the procedure  ANESTHESIA/SEDATION: Sedation time  120 minutes  CONTRAST:  45mL OMNIPAQUE IOHEXOL 300 MG/ML  SOLN  FLUOROSCOPY TIME:  33 minutes; 54 seconds.  (2,277 mGy)  COMPLICATIONS: None immediate  PROCEDURE: Informed consent was obtained from the patient following explanation of the procedure, risks, benefits and alternatives. The patient understands, agrees and consents for the procedure. All questions were addressed. A time out was performed prior to the initiation of the procedure. Maximal barrier sterile technique utilized including caps, mask, sterile gowns, sterile gloves, large sterile drape, hand hygiene, and Betadine prep.  The right femoral head was marked fluoroscopically. Under sterile conditions and local anesthesia, the right common femoral artery access was performed with a micropuncture needle. Under direct ultrasound guidance, the right common femoral was accessed with a micropuncture kit. An ultrasound image was saved for documentation purposes. This allowed for placement of a 5-French vascular sheath. A limited arteriogram was performed through the side arm of the sheath confirming appropriate access within the right common femoral artery.  A C2 catheter was utilized to select the contralateral  left internal iliac artery. Selective left internal iliac angiogram was performed. The tortuous left uterine artery was identified. Selective catheterization was performed of the left uterine artery with a microcatheter and micro guide  wire. A selective left uterine angiogram was performed. This demonstrated patency of the left uterine artery. Mild diffuse hypervascularity of the enlarged fibroid uterus. Access was adequate for embolization. For embolization, 3 vials of 500 - 700 micron and 1 vial of 700 - 900 Embospheres were injected into the left uterine artery. Post embolization angiogram confirms complete stasis of the left uterine vascular territory. Microcatheter was removed.  The C2 catheter was retracted and utilized to select the ipsilateral right internal iliac artery. Selective right internal iliac angiogram was performed. The patent right uterine artery was identified. For selective catheterization, the micro catheter and guidewire were utilized to select the right uterine artery. Selective right uterine angiogram was performed. This demonstrated patency of the right uterine artery. Catheter position was safe for embolization. Embolization was performed to complete stasis with injection of 3 vials of 500-700 micron and 3 vials of 700 - 900 Embospheres. Post embolization angiogram confirms complete stasis of the right uterine vascular territory.  At this point, all wires, catheters and sheaths were removed from the patient. Hemostasis was achieved at the right groin access site with  The patient tolerated the procedure well without immediate post procedural complication.  FINDINGS: Left internal iliac arteriogram demonstrates expected hypertrophy of the tortuous left uterine artery supplying an enlarged and myomatous uterus. Post embolization angiogram confirms complete stasis of the left uterine vascular territory.  Right internal iliac arteriogram demonstrates expected hypertrophy of the tortuous right  uterine artery supplying an enlarged and myomatous uterus. Post embolization angiogram confirms complete stasis of the right uterine vascular territory.  IMPRESSION: Technically successful bilateral uterine artery embolization (U F E).  PLAN: The patient will be seen in follow-up consultation in approximately 1 month. Note, no imaging is necessary for this initial follow-up consultation.   Electronically Signed   By: Sandi Mariscal M.D.   On: 04/27/2014 15:35   Ir US Guide Vasc Access Right  04/27/2014   INDICATION: Symptomatic uterine fibroids. Patient initially seen in consultation on 04/05/2014 and has elected to undergo uterine fibroid embolization secondary to extensive menorrhagia.  EXAM: Uterine Fibroid Embolization  COMPARISON:  Contrast enhanced pelvic MRI - 04/09/2014  MEDICATIONS: Fentanyl 400 mcg IV; Versed 6 mg IV; Toradol 30 mg IV: Ancef 2 gm IV; The antibiotic was administered within one hour of the procedure  ANESTHESIA/SEDATION: Sedation time  120 minutes  CONTRAST:  50m OMNIPAQUE IOHEXOL 300 MG/ML  SOLN  FLUOROSCOPY TIME:  33 minutes; 54 seconds.  (24,782mGy)  COMPLICATIONS: None immediate  PROCEDURE: Informed consent was obtained from the patient following explanation of the procedure, risks, benefits and alternatives. The patient understands, agrees and consents for the procedure. All questions were addressed. A time out was performed prior to the initiation of the procedure. Maximal barrier sterile technique utilized including caps, mask, sterile gowns, sterile gloves, large sterile drape, hand hygiene, and Betadine prep.  The right femoral head was marked fluoroscopically. Under sterile conditions and local anesthesia, the right common femoral artery access was performed with a micropuncture needle. Under direct ultrasound guidance, the right common femoral was accessed with a micropuncture kit. An ultrasound image was saved for documentation purposes. This allowed for placement of a 5-French  vascular sheath. A limited arteriogram was performed through the side arm of the sheath confirming appropriate access within the right common femoral artery.  A C2 catheter was utilized to select the contralateral left internal iliac artery. Selective left internal iliac angiogram was performed. The tortuous left uterine artery was identified.  Selective catheterization was performed of the left uterine artery with a microcatheter and micro guide wire. A selective left uterine angiogram was performed. This demonstrated patency of the left uterine artery. Mild diffuse hypervascularity of the enlarged fibroid uterus. Access was adequate for embolization. For embolization, 3 vials of 500 - 700 micron and 1 vial of 700 - 900 Embospheres were injected into the left uterine artery. Post embolization angiogram confirms complete stasis of the left uterine vascular territory. Microcatheter was removed.  The C2 catheter was retracted and utilized to select the ipsilateral right internal iliac artery. Selective right internal iliac angiogram was performed. The patent right uterine artery was identified. For selective catheterization, the micro catheter and guidewire were utilized to select the right uterine artery. Selective right uterine angiogram was performed. This demonstrated patency of the right uterine artery. Catheter position was safe for embolization. Embolization was performed to complete stasis with injection of 3 vials of 500-700 micron and 3 vials of 700 - 900 Embospheres. Post embolization angiogram confirms complete stasis of the right uterine vascular territory.  At this point, all wires, catheters and sheaths were removed from the patient. Hemostasis was achieved at the right groin access site with  The patient tolerated the procedure well without immediate post procedural complication.  FINDINGS: Left internal iliac arteriogram demonstrates expected hypertrophy of the tortuous left uterine artery supplying an  enlarged and myomatous uterus. Post embolization angiogram confirms complete stasis of the left uterine vascular territory.  Right internal iliac arteriogram demonstrates expected hypertrophy of the tortuous right uterine artery supplying an enlarged and myomatous uterus. Post embolization angiogram confirms complete stasis of the right uterine vascular territory.  IMPRESSION: Technically successful bilateral uterine artery embolization (U F E).  PLAN: The patient will be seen in follow-up consultation in approximately 1 month. Note, no imaging is necessary for this initial follow-up consultation.   Electronically Signed   By: Sandi Mariscal M.D.   On: 04/27/2014 15:35   Ir Embo Tumor Organ Ischemia Infarct Inc Guide Roadmapping  04/27/2014   INDICATION: Symptomatic uterine fibroids. Patient initially seen in consultation on 04/05/2014 and has elected to undergo uterine fibroid embolization secondary to extensive menorrhagia.  EXAM: Uterine Fibroid Embolization  COMPARISON:  Contrast enhanced pelvic MRI - 04/09/2014  MEDICATIONS: Fentanyl 400 mcg IV; Versed 6 mg IV; Toradol 30 mg IV: Ancef 2 gm IV; The antibiotic was administered within one hour of the procedure  ANESTHESIA/SEDATION: Sedation time  120 minutes  CONTRAST:  46m OMNIPAQUE IOHEXOL 300 MG/ML  SOLN  FLUOROSCOPY TIME:  33 minutes; 54 seconds.  (29,518mGy)  COMPLICATIONS: None immediate  PROCEDURE: Informed consent was obtained from the patient following explanation of the procedure, risks, benefits and alternatives. The patient understands, agrees and consents for the procedure. All questions were addressed. A time out was performed prior to the initiation of the procedure. Maximal barrier sterile technique utilized including caps, mask, sterile gowns, sterile gloves, large sterile drape, hand hygiene, and Betadine prep.  The right femoral head was marked fluoroscopically. Under sterile conditions and local anesthesia, the right common femoral artery  access was performed with a micropuncture needle. Under direct ultrasound guidance, the right common femoral was accessed with a micropuncture kit. An ultrasound image was saved for documentation purposes. This allowed for placement of a 5-French vascular sheath. A limited arteriogram was performed through the side arm of the sheath confirming appropriate access within the right common femoral artery.  A C2 catheter was utilized to select the contralateral  left internal iliac artery. Selective left internal iliac angiogram was performed. The tortuous left uterine artery was identified. Selective catheterization was performed of the left uterine artery with a microcatheter and micro guide wire. A selective left uterine angiogram was performed. This demonstrated patency of the left uterine artery. Mild diffuse hypervascularity of the enlarged fibroid uterus. Access was adequate for embolization. For embolization, 3 vials of 500 - 700 micron and 1 vial of 700 - 900 Embospheres were injected into the left uterine artery. Post embolization angiogram confirms complete stasis of the left uterine vascular territory. Microcatheter was removed.  The C2 catheter was retracted and utilized to select the ipsilateral right internal iliac artery. Selective right internal iliac angiogram was performed. The patent right uterine artery was identified. For selective catheterization, the micro catheter and guidewire were utilized to select the right uterine artery. Selective right uterine angiogram was performed. This demonstrated patency of the right uterine artery. Catheter position was safe for embolization. Embolization was performed to complete stasis with injection of 3 vials of 500-700 micron and 3 vials of 700 - 900 Embospheres. Post embolization angiogram confirms complete stasis of the right uterine vascular territory.  At this point, all wires, catheters and sheaths were removed from the patient. Hemostasis was achieved at  the right groin access site with  The patient tolerated the procedure well without immediate post procedural complication.  FINDINGS: Left internal iliac arteriogram demonstrates expected hypertrophy of the tortuous left uterine artery supplying an enlarged and myomatous uterus. Post embolization angiogram confirms complete stasis of the left uterine vascular territory.  Right internal iliac arteriogram demonstrates expected hypertrophy of the tortuous right uterine artery supplying an enlarged and myomatous uterus. Post embolization angiogram confirms complete stasis of the right uterine vascular territory.  IMPRESSION: Technically successful bilateral uterine artery embolization (U F E).  PLAN: The patient will be seen in follow-up consultation in approximately 1 month. Note, no imaging is necessary for this initial follow-up consultation.   Electronically Signed   By: Sandi Mariscal M.D.   On: 04/27/2014 15:35    Labs:  CBC:  Recent Labs  12/19/13 1714 01/22/14 1625 03/12/14 0946 04/27/14 1045  WBC 6.7 5.9 6.0 5.0  HGB 9.3* 10.6* 11.2* 11.4*  HCT 28.9* 32.8* 33.5* 34.6*  PLT 340 269 407* 281    COAGS:  Recent Labs  04/27/14 1045  INR 1.01    BMP:  Recent Labs  12/19/13 1714 01/22/14 1625 03/12/14 0946 04/27/14 1045  NA 134* 139 138 137  K 3.4* 3.7 4.2 3.3*  CL 97 105 102 98  CO2 29 24 25 24   GLUCOSE 83 102* 87 100*  BUN 12 15 8 12   CALCIUM 9.7 9.3 9.3 9.5  CREATININE 0.68 0.68 0.65 0.70  GFRNONAA >89 >89 >89 >90  GFRAA >89 >89 >89 >90    LIVER FUNCTION TESTS:  Recent Labs  06/15/13 0922 12/19/13 1714 03/12/14 0946  BILITOT 0.2* 0.5 0.4  AST 13 17 12   ALT 8 10 <8  ALKPHOS 60 59 78  PROT 7.4 7.8 7.5  ALBUMIN 3.9 4.3 4.0    Assessment and Plan: S/p bilateral [place] for symptomatic uterine fibroids 11/27; increase fentanyl PCA to full dose; hydrate; prn antiemetics;for overnight obs; f/u in IR clinic with Dr. Pascal Lux  in 1 month        Signed: Autumn Messing 04/27/2014, 4:43 PM

## 2014-04-28 ENCOUNTER — Other Ambulatory Visit: Payer: Self-pay | Admitting: Radiology

## 2014-04-28 DIAGNOSIS — D259 Leiomyoma of uterus, unspecified: Secondary | ICD-10-CM

## 2014-04-28 DIAGNOSIS — D252 Subserosal leiomyoma of uterus: Secondary | ICD-10-CM | POA: Diagnosis not present

## 2014-04-28 MED ORDER — IBUPROFEN 600 MG PO TABS
600.0000 mg | ORAL_TABLET | Freq: Once | ORAL | Status: AC
  Administered 2014-04-28: 600 mg via ORAL
  Filled 2014-04-28: qty 1
  Filled 2014-04-28: qty 3

## 2014-04-28 NOTE — Progress Notes (Signed)
Pt c/o of 10/10 pain, not relieved by current fentanyl PCA dose. Pt is maxing out PCA dose, pushed over 200 times with 20 deliveries. Dr. Vernard Gambles on call notified and orders received to increase fentanyl dose. Noreene Larsson RN, BSN  0000 04/28/14 Pt states that pain has decreased to 8/10. Will continue to monitor. Noreene Larsson RN, BSN

## 2014-04-28 NOTE — Discharge Instructions (Signed)
Uterine Artery Embolization for Fibroids Uterine artery embolization is a nonsurgical treatment to shrink fibroids. A thin plastic tube (catheter) is used to inject material that blocks off the blood supply to the fibroid, which causes the fibroid to shrink. LET [facility] CARE PROVIDER KNOW ABOUT:  Any allergies you have.  All medicines you are taking, including vitamins, herbs, eye drops, creams, and over-the-counter medicines.  Previous problems you or members of your family have had with the use of anesthetics.  Any blood disorders you have.  Previous surgeries you have had.  Medical conditions you have. RISKS AND COMPLICATIONS  Injury to the uterus from decreased blood supply  Infection.  Blood infection (septicemia).  Lack of menstrual periods (amenorrhea).  Death of tissue cells (necrosis) around your bladder or vulva.  Development of a hole between organs or from an organ to the surface of your skin (fistula).  Blood clot in the legs (deep vein thrombosis) or lung (pulmonary embolus). BEFORE THE PROCEDURE  Ask your health care provider about changing or stopping your regular medicines.   Do not take aspirin or blood thinners (anticoagulants) for 1 week before the surgery or as directed by your health care provider.  Do not eat or drink anything for 8 hours before the surgery or as directed by your health care provider.   Empty your bladder before the procedure begins. PROCEDURE   An IV tube will be placed into one of your veins. This will be used to give you a sedative and pain medication (conscious sedation).  You will be given a medicine that numbs the area (local anesthetic).  A small cut will be made in your groin. A catheter is then inserted into the main artery of your leg.  The catheter will be guided through the artery to your uterus. A series of images will be taken while dye is injected through the catheter in your groin. X-rays are taken at the  same time. This is done to provide a road map of the blood supply to your uterus and fibroids.  Tiny plastic spheres, about the size of sand grains, will be injected through the catheter. Metal coils may be used to help block the artery. The particles will lodge in tiny branches of the uterine artery that supplies blood to the fibroids.  The procedure is repeated on the artery that supplies the other side of the uterus.  The catheter is then removed and pressure is held to stop any bleeding. No stitches are needed.  A dressing is then placed over the cut (incision). AFTER THE PROCEDURE  You will be taken to a recovery area where your progress will be monitored until you are awake, stable, and taking fluids well. If there are no other problems, you will then be moved to a regular hospital room.  You will be observed overnight in the hospital.  You will have cramping that should be controlled with pain medication. Document Released: 08/03/2005 Document Revised: 03/08/2013 Document Reviewed: 12/01/2012 [facility] Patient Information 2015 [place], [place]. This information is not intended to replace advice given to you by your health care provider. Make sure you discuss any questions you have with your health care provider. Uterine Artery Embolization for Fibroids Uterine artery embolization is a nonsurgical treatment to shrink fibroids. A thin plastic tube (catheter) is used to inject material that blocks off the blood supply to the fibroid, which causes the fibroid to shrink. LET [facility] CARE PROVIDER KNOW ABOUT:  Any allergies you have.  All medicines you are taking, including vitamins, herbs, eye drops, creams, and over-the-counter medicines.  Previous problems you or members of your family have had with the use of anesthetics.  Any blood disorders you have.  Previous surgeries you have had.  Medical conditions you have. RISKS AND COMPLICATIONS  Injury to the uterus from decreased  blood supply  Infection.  Blood infection (septicemia).  Lack of menstrual periods (amenorrhea).  Death of tissue cells (necrosis) around your bladder or vulva.  Development of a hole between organs or from an organ to the surface of your skin (fistula).  Blood clot in the legs (deep vein thrombosis) or lung (pulmonary embolus). BEFORE THE PROCEDURE  Ask your health care provider about changing or stopping your regular medicines.   Do not take aspirin or blood thinners (anticoagulants) for 1 week before the surgery or as directed by your health care provider.  Do not eat or drink anything for 8 hours before the surgery or as directed by your health care provider.   Empty your bladder before the procedure begins. PROCEDURE   An IV tube will be placed into one of your veins. This will be used to give you a sedative and pain medication (conscious sedation).  You will be given a medicine that numbs the area (local anesthetic).  A small cut will be made in your groin. A catheter is then inserted into the main artery of your leg.  The catheter will be guided through the artery to your uterus. A series of images will be taken while dye is injected through the catheter in your groin. X-rays are taken at the same time. This is done to provide a road map of the blood supply to your uterus and fibroids.  Tiny plastic spheres, about the size of sand grains, will be injected through the catheter. Metal coils may be used to help block the artery. The particles will lodge in tiny branches of the uterine artery that supplies blood to the fibroids.  The procedure is repeated on the artery that supplies the other side of the uterus.  The catheter is then removed and pressure is held to stop any bleeding. No stitches are needed.  A dressing is then placed over the cut (incision). AFTER THE PROCEDURE  You will be taken to a recovery area where your progress will be monitored until you are  awake, stable, and taking fluids well. If there are no other problems, you will then be moved to a regular hospital room.  You will be observed overnight in the hospital.  You will have cramping that should be controlled with pain medication. Document Released: 08/03/2005 Document Revised: 03/08/2013 Document Reviewed: 12/01/2012 [facility] Patient Information 2015 [place], [place]. This information is not intended to replace advice given to you by your health care provider. Make sure you discuss any questions you have with your health care provider.

## 2014-04-28 NOTE — Progress Notes (Signed)
Discharge instructions reviewed with patient using teach back method and she demonstrated understanding.  Patient appears stable for discharge home.  Patient's assessment unchanged from this am.  Dressing removed from right groin and replaced with band-aid.  Zandra Abts [facility]  04/28/2014  3:46 PM

## 2014-04-28 NOTE — Discharge Summary (Signed)
Patient ID: Dawn Pope MRN: [id number] DOB/AGE: 02-19-1971 43 y.o.  Admit date: 04/27/2014 Discharge date: 04/28/2014  Admission Diagnoses: Symptomatic uterine fibroids  Discharge Diagnoses: Symptomatic uterine fibroids, status post successful bilateral uterine artery embolization on 04/27/2014 Active Problems:   Fibroid, uterine  Past Medical History  Diagnosis Date  . Hypertension   . Vitamin D deficiency   . Anemia   . Pulmonary hypertension Via Echo 2010    EF 65% RVSP 45 (Dr. Stanford Pope)  . Hypokalemia   . Hypomagnesemia    Past Surgical History  Procedure Laterality Date  . No past surgeries        Discharged Condition: good  Hospital Course: Dawn Pope  is a 43 year old black female, patient of Dr. Arvella Pope, who was referred to the interventional radiology service for further evaluation/treatment options for symptomatic uterine fibroids, manifested primarily by anemia, menorrhagia, occasional pelvic pain and urinary frequency. The patient was seen in consultation by Dr. Sandi Pope and deemed a suitable candidate for uterine fibroid embolization. On 04/27/2014 the patient underwent technically successful bilateral uterine artery embolization via moderate sedation. The procedure was performed without immediate complications and the patient was subsequently admitted to the hospital for overnight observation for pain control. She was placed on a fentanyl PCA pump and given antiemetics as needed. The patient experienced some intermittent nausea, vomiting and pelvic cramping as expected post embolization. These symptoms were treated with the above measures. On the day of discharge the patient was feeling better but continued to have some mild pelvic cramping. She was able to tolerate her diet, void and ambulate without difficulty. Findings were discussed with Dr. Vernard Pope and  the patient  was deemed  stable for discharge at this time. She will be discharged home on  Vicodin 5/325, #30, no refills, patient to take 1-2 tablets every 4-6 hours as needed for pain. Colace 100 mg, #20, no refills, patient to take 1 tablet twice daily as needed for constipation. Ibuprofen 600 mg, #20, no refills, patient to take 1 tablet every 6 hours for the next 5 days. Phenergan 25 mg, #10, no refills, patient to take 1 tablet every 6 hours as needed for nausea. The patient will follow-up with Dr. Pascal Pope in the interventional radiology clinic in approximately one month. She will continue with her current home medications and contact our service with any additional questions or concerns.  Consults: none  Significant Diagnostic Studies:  Results for orders placed or performed during the hospital encounter of 40/98/11  Basic metabolic panel  Result Value Ref Range   Sodium 137 137 - 147 mEq/L   Potassium 3.3 (L) 3.7 - 5.3 mEq/L   Chloride 98 96 - 112 mEq/L   CO2 24 19 - 32 mEq/L   Glucose, Bld 100 (H) 70 - 99 mg/dL   BUN 12 6 - 23 mg/dL   Creatinine, Ser 0.70 0.50 - 1.10 mg/dL   Calcium 9.5 8.4 - 10.5 mg/dL   GFR calc non Af Amer >90 >90 mL/min   GFR calc Af Amer >90 >90 mL/min   Anion gap 15 5 - 15  CBC with Differential  Result Value Ref Range   WBC 5.0 4.0 - 10.5 K/uL   RBC 4.05 3.87 - 5.11 MIL/uL   Hemoglobin 11.4 (L) 12.0 - 15.0 g/dL   HCT 34.6 (L) 36.0 - 46.0 %   MCV 85.4 78.0 - 100.0 fL   MCH 28.1 26.0 - 34.0 pg   MCHC 32.9 30.0 -  36.0 g/dL   RDW 13.2 11.5 - 15.5 %   Platelets 281 150 - 400 K/uL   Neutrophils Relative % 59 43 - 77 %   Neutro Abs 3.0 1.7 - 7.7 K/uL   Lymphocytes Relative 31 12 - 46 %   Lymphs Abs 1.5 0.7 - 4.0 K/uL   Monocytes Relative 9 3 - 12 %   Monocytes Absolute 0.4 0.1 - 1.0 K/uL   Eosinophils Relative 1 0 - 5 %   Eosinophils Absolute 0.1 0.0 - 0.7 K/uL   Basophils Relative 0 0 - 1 %   Basophils Absolute 0.0 0.0 - 0.1 K/uL  hCG, serum, qualitative  Result Value Ref Range   Preg, Serum NEGATIVE NEGATIVE  Protime-INR  Result Value  Ref Range   Prothrombin Time 13.4 11.6 - 15.2 seconds   INR 1.01 0.00 - 1.49     Treatments: Mr Pelvis W Wo Contrast  04/09/2014   CLINICAL DATA:  Fibroids. Preop evaluation for uterine artery embolization.  EXAM: MRI PELVIS WITHOUT AND WITH CONTRAST  TECHNIQUE: Multiplanar multisequence MR imaging of the pelvis was performed both before and after administration of intravenous contrast.  CONTRAST:  53m MULTIHANCE GADOBENATE DIMEGLUMINE 529 MG/ML IV SOLN  COMPARISON:  None.  FINDINGS: Uterus: Measures 20.2 by 9.9 x 18.7 cm. Estimated volume = 1,956 cc  Fibroids: Innumerable fibroids are seen involving the uterus diffusely, with largest fibroid in the left anterior corpus measuring 8.6 cm in maximum diameter.  Intracavitary fibroids:  None.  Pedunculated fibroids: A 6.9 cm fibroid is seen projecting from the right lateral upper uterine corpus, with a broad-based attachment measuring approximately 4 cm in thickness.  Fibroid contrast enhancement: All fibroids show diffuse contrast enhancement, except for a single 6.5 cm degenerated fibroid in the right uterine fundus.  Cervix:  Normal appearance.  Right ovary:  Normal appearance.  No adnexal mass identified.  Left ovary:  Normal appearance.  No adnexal mass identified.  Free fluid:  Tiny amount of free fluid in pelvic cul-de-sac  Non-GYN findings: No other pelvic masses, adenopathy, or inflammatory process identified.  IMPRESSION: Markedly enlarged uterus with numerous fibroids, as described above. 7 cm pedunculated subserosal fibroid seen along the right lateral corpus, which shows a broad-based attachment measuring approximately 4 cm in thickness. No intracavitary fibroids identified.  Normal appearance of both ovaries.  No adnexal mass identified.   Electronically Signed   By: JEarle GellM.D.   On: 04/09/2014 13:54   Ir Angiogram Pelvis Selective Or Supraselective  04/27/2014   INDICATION: Symptomatic uterine fibroids. Patient initially seen in  consultation on 04/05/2014 and has elected to undergo uterine fibroid embolization secondary to extensive menorrhagia.  EXAM: Uterine Fibroid Embolization  COMPARISON:  Contrast enhanced pelvic MRI - 04/09/2014  MEDICATIONS: Fentanyl 400 mcg IV; Versed 6 mg IV; Toradol 30 mg IV: Ancef 2 gm IV; The antibiotic was administered within one hour of the procedure  ANESTHESIA/SEDATION: Sedation time  120 minutes  CONTRAST:  836mOMNIPAQUE IOHEXOL 300 MG/ML  SOLN  FLUOROSCOPY TIME:  33 minutes; 54 seconds.  (2,8,242Gy)  COMPLICATIONS: None immediate  PROCEDURE: Informed consent was obtained from the patient following explanation of the procedure, risks, benefits and alternatives. The patient understands, agrees and consents for the procedure. All questions were addressed. A time out was performed prior to the initiation of the procedure. Maximal barrier sterile technique utilized including caps, mask, sterile gowns, sterile gloves, large sterile drape, hand hygiene, and Betadine prep.  The right femoral  head was marked fluoroscopically. Under sterile conditions and local anesthesia, the right common femoral artery access was performed with a micropuncture needle. Under direct ultrasound guidance, the right common femoral was accessed with a micropuncture kit. An ultrasound image was saved for documentation purposes. This allowed for placement of a 5-French vascular sheath. A limited arteriogram was performed through the side arm of the sheath confirming appropriate access within the right common femoral artery.  A C2 catheter was utilized to select the contralateral left internal iliac artery. Selective left internal iliac angiogram was performed. The tortuous left uterine artery was identified. Selective catheterization was performed of the left uterine artery with a microcatheter and micro guide wire. A selective left uterine angiogram was performed. This demonstrated patency of the left uterine artery. Mild diffuse  hypervascularity of the enlarged fibroid uterus. Access was adequate for embolization. For embolization, 3 vials of 500 - 700 micron and 1 vial of 700 - 900 Embospheres were injected into the left uterine artery. Post embolization angiogram confirms complete stasis of the left uterine vascular territory. Microcatheter was removed.  The C2 catheter was retracted and utilized to select the ipsilateral right internal iliac artery. Selective right internal iliac angiogram was performed. The patent right uterine artery was identified. For selective catheterization, the micro catheter and guidewire were utilized to select the right uterine artery. Selective right uterine angiogram was performed. This demonstrated patency of the right uterine artery. Catheter position was safe for embolization. Embolization was performed to complete stasis with injection of 3 vials of 500-700 micron and 3 vials of 700 - 900 Embospheres. Post embolization angiogram confirms complete stasis of the right uterine vascular territory.  At this point, all wires, catheters and sheaths were removed from the patient. Hemostasis was achieved at the right groin access site with  The patient tolerated the procedure well without immediate post procedural complication.  FINDINGS: Left internal iliac arteriogram demonstrates expected hypertrophy of the tortuous left uterine artery supplying an enlarged and myomatous uterus. Post embolization angiogram confirms complete stasis of the left uterine vascular territory.  Right internal iliac arteriogram demonstrates expected hypertrophy of the tortuous right uterine artery supplying an enlarged and myomatous uterus. Post embolization angiogram confirms complete stasis of the right uterine vascular territory.  IMPRESSION: Technically successful bilateral uterine artery embolization (U F E).  PLAN: The patient will be seen in follow-up consultation in approximately 1 month. Note, no imaging is necessary for this  initial follow-up consultation.   Electronically Signed   By: Dawn Pope M.D.   On: 04/27/2014 15:35   Ir Angiogram Pelvis Selective Or Supraselective  04/27/2014   INDICATION: Symptomatic uterine fibroids. Patient initially seen in consultation on 04/05/2014 and has elected to undergo uterine fibroid embolization secondary to extensive menorrhagia.  EXAM: Uterine Fibroid Embolization  COMPARISON:  Contrast enhanced pelvic MRI - 04/09/2014  MEDICATIONS: Fentanyl 400 mcg IV; Versed 6 mg IV; Toradol 30 mg IV: Ancef 2 gm IV; The antibiotic was administered within one hour of the procedure  ANESTHESIA/SEDATION: Sedation time  120 minutes  CONTRAST:  19m OMNIPAQUE IOHEXOL 300 MG/ML  SOLN  FLUOROSCOPY TIME:  33 minutes; 54 seconds.  (21,194mGy)  COMPLICATIONS: None immediate  PROCEDURE: Informed consent was obtained from the patient following explanation of the procedure, risks, benefits and alternatives. The patient understands, agrees and consents for the procedure. All questions were addressed. A time out was performed prior to the initiation of the procedure. Maximal barrier sterile technique utilized including caps, mask, sterile  gowns, sterile gloves, large sterile drape, hand hygiene, and Betadine prep.  The right femoral head was marked fluoroscopically. Under sterile conditions and local anesthesia, the right common femoral artery access was performed with a micropuncture needle. Under direct ultrasound guidance, the right common femoral was accessed with a micropuncture kit. An ultrasound image was saved for documentation purposes. This allowed for placement of a 5-French vascular sheath. A limited arteriogram was performed through the side arm of the sheath confirming appropriate access within the right common femoral artery.  A C2 catheter was utilized to select the contralateral left internal iliac artery. Selective left internal iliac angiogram was performed. The tortuous left uterine artery was  identified. Selective catheterization was performed of the left uterine artery with a microcatheter and micro guide wire. A selective left uterine angiogram was performed. This demonstrated patency of the left uterine artery. Mild diffuse hypervascularity of the enlarged fibroid uterus. Access was adequate for embolization. For embolization, 3 vials of 500 - 700 micron and 1 vial of 700 - 900 Embospheres were injected into the left uterine artery. Post embolization angiogram confirms complete stasis of the left uterine vascular territory. Microcatheter was removed.  The C2 catheter was retracted and utilized to select the ipsilateral right internal iliac artery. Selective right internal iliac angiogram was performed. The patent right uterine artery was identified. For selective catheterization, the micro catheter and guidewire were utilized to select the right uterine artery. Selective right uterine angiogram was performed. This demonstrated patency of the right uterine artery. Catheter position was safe for embolization. Embolization was performed to complete stasis with injection of 3 vials of 500-700 micron and 3 vials of 700 - 900 Embospheres. Post embolization angiogram confirms complete stasis of the right uterine vascular territory.  At this point, all wires, catheters and sheaths were removed from the patient. Hemostasis was achieved at the right groin access site with  The patient tolerated the procedure well without immediate post procedural complication.  FINDINGS: Left internal iliac arteriogram demonstrates expected hypertrophy of the tortuous left uterine artery supplying an enlarged and myomatous uterus. Post embolization angiogram confirms complete stasis of the left uterine vascular territory.  Right internal iliac arteriogram demonstrates expected hypertrophy of the tortuous right uterine artery supplying an enlarged and myomatous uterus. Post embolization angiogram confirms complete stasis of the  right uterine vascular territory.  IMPRESSION: Technically successful bilateral uterine artery embolization (U F E).  PLAN: The patient will be seen in follow-up consultation in approximately 1 month. Note, no imaging is necessary for this initial follow-up consultation.   Electronically Signed   By: Dawn Pope M.D.   On: 04/27/2014 15:35   Ir Angiogram Selective Each Additional Vessel  04/27/2014   INDICATION: Symptomatic uterine fibroids. Patient initially seen in consultation on 04/05/2014 and has elected to undergo uterine fibroid embolization secondary to extensive menorrhagia.  EXAM: Uterine Fibroid Embolization  COMPARISON:  Contrast enhanced pelvic MRI - 04/09/2014  MEDICATIONS: Fentanyl 400 mcg IV; Versed 6 mg IV; Toradol 30 mg IV: Ancef 2 gm IV; The antibiotic was administered within one hour of the procedure  ANESTHESIA/SEDATION: Sedation time  120 minutes  CONTRAST:  63m OMNIPAQUE IOHEXOL 300 MG/ML  SOLN  FLUOROSCOPY TIME:  33 minutes; 54 seconds.  (24,268mGy)  COMPLICATIONS: None immediate  PROCEDURE: Informed consent was obtained from the patient following explanation of the procedure, risks, benefits and alternatives. The patient understands, agrees and consents for the procedure. All questions were addressed. A time out was performed prior  to the initiation of the procedure. Maximal barrier sterile technique utilized including caps, mask, sterile gowns, sterile gloves, large sterile drape, hand hygiene, and Betadine prep.  The right femoral head was marked fluoroscopically. Under sterile conditions and local anesthesia, the right common femoral artery access was performed with a micropuncture needle. Under direct ultrasound guidance, the right common femoral was accessed with a micropuncture kit. An ultrasound image was saved for documentation purposes. This allowed for placement of a 5-French vascular sheath. A limited arteriogram was performed through the side arm of the sheath confirming  appropriate access within the right common femoral artery.  A C2 catheter was utilized to select the contralateral left internal iliac artery. Selective left internal iliac angiogram was performed. The tortuous left uterine artery was identified. Selective catheterization was performed of the left uterine artery with a microcatheter and micro guide wire. A selective left uterine angiogram was performed. This demonstrated patency of the left uterine artery. Mild diffuse hypervascularity of the enlarged fibroid uterus. Access was adequate for embolization. For embolization, 3 vials of 500 - 700 micron and 1 vial of 700 - 900 Embospheres were injected into the left uterine artery. Post embolization angiogram confirms complete stasis of the left uterine vascular territory. Microcatheter was removed.  The C2 catheter was retracted and utilized to select the ipsilateral right internal iliac artery. Selective right internal iliac angiogram was performed. The patent right uterine artery was identified. For selective catheterization, the micro catheter and guidewire were utilized to select the right uterine artery. Selective right uterine angiogram was performed. This demonstrated patency of the right uterine artery. Catheter position was safe for embolization. Embolization was performed to complete stasis with injection of 3 vials of 500-700 micron and 3 vials of 700 - 900 Embospheres. Post embolization angiogram confirms complete stasis of the right uterine vascular territory.  At this point, all wires, catheters and sheaths were removed from the patient. Hemostasis was achieved at the right groin access site with  The patient tolerated the procedure well without immediate post procedural complication.  FINDINGS: Left internal iliac arteriogram demonstrates expected hypertrophy of the tortuous left uterine artery supplying an enlarged and myomatous uterus. Post embolization angiogram confirms complete stasis of the left  uterine vascular territory.  Right internal iliac arteriogram demonstrates expected hypertrophy of the tortuous right uterine artery supplying an enlarged and myomatous uterus. Post embolization angiogram confirms complete stasis of the right uterine vascular territory.  IMPRESSION: Technically successful bilateral uterine artery embolization (U F E).  PLAN: The patient will be seen in follow-up consultation in approximately 1 month. Note, no imaging is necessary for this initial follow-up consultation.   Electronically Signed   By: Dawn Pope M.D.   On: 04/27/2014 15:35   Ir Angiogram Selective Each Additional Vessel  04/27/2014   INDICATION: Symptomatic uterine fibroids. Patient initially seen in consultation on 04/05/2014 and has elected to undergo uterine fibroid embolization secondary to extensive menorrhagia.  EXAM: Uterine Fibroid Embolization  COMPARISON:  Contrast enhanced pelvic MRI - 04/09/2014  MEDICATIONS: Fentanyl 400 mcg IV; Versed 6 mg IV; Toradol 30 mg IV: Ancef 2 gm IV; The antibiotic was administered within one hour of the procedure  ANESTHESIA/SEDATION: Sedation time  120 minutes  CONTRAST:  56m OMNIPAQUE IOHEXOL 300 MG/ML  SOLN  FLUOROSCOPY TIME:  33 minutes; 54 seconds.  (25,498mGy)  COMPLICATIONS: None immediate  PROCEDURE: Informed consent was obtained from the patient following explanation of the procedure, risks, benefits and alternatives. The patient understands, agrees  and consents for the procedure. All questions were addressed. A time out was performed prior to the initiation of the procedure. Maximal barrier sterile technique utilized including caps, mask, sterile gowns, sterile gloves, large sterile drape, hand hygiene, and Betadine prep.  The right femoral head was marked fluoroscopically. Under sterile conditions and local anesthesia, the right common femoral artery access was performed with a micropuncture needle. Under direct ultrasound guidance, the right common femoral was  accessed with a micropuncture kit. An ultrasound image was saved for documentation purposes. This allowed for placement of a 5-French vascular sheath. A limited arteriogram was performed through the side arm of the sheath confirming appropriate access within the right common femoral artery.  A C2 catheter was utilized to select the contralateral left internal iliac artery. Selective left internal iliac angiogram was performed. The tortuous left uterine artery was identified. Selective catheterization was performed of the left uterine artery with a microcatheter and micro guide wire. A selective left uterine angiogram was performed. This demonstrated patency of the left uterine artery. Mild diffuse hypervascularity of the enlarged fibroid uterus. Access was adequate for embolization. For embolization, 3 vials of 500 - 700 micron and 1 vial of 700 - 900 Embospheres were injected into the left uterine artery. Post embolization angiogram confirms complete stasis of the left uterine vascular territory. Microcatheter was removed.  The C2 catheter was retracted and utilized to select the ipsilateral right internal iliac artery. Selective right internal iliac angiogram was performed. The patent right uterine artery was identified. For selective catheterization, the micro catheter and guidewire were utilized to select the right uterine artery. Selective right uterine angiogram was performed. This demonstrated patency of the right uterine artery. Catheter position was safe for embolization. Embolization was performed to complete stasis with injection of 3 vials of 500-700 micron and 3 vials of 700 - 900 Embospheres. Post embolization angiogram confirms complete stasis of the right uterine vascular territory.  At this point, all wires, catheters and sheaths were removed from the patient. Hemostasis was achieved at the right groin access site with  The patient tolerated the procedure well without immediate post procedural  complication.  FINDINGS: Left internal iliac arteriogram demonstrates expected hypertrophy of the tortuous left uterine artery supplying an enlarged and myomatous uterus. Post embolization angiogram confirms complete stasis of the left uterine vascular territory.  Right internal iliac arteriogram demonstrates expected hypertrophy of the tortuous right uterine artery supplying an enlarged and myomatous uterus. Post embolization angiogram confirms complete stasis of the right uterine vascular territory.  IMPRESSION: Technically successful bilateral uterine artery embolization (U F E).  PLAN: The patient will be seen in follow-up consultation in approximately 1 month. Note, no imaging is necessary for this initial follow-up consultation.   Electronically Signed   By: Dawn Pope M.D.   On: 04/27/2014 15:35   Ir US Guide Vasc Access Right  04/27/2014   INDICATION: Symptomatic uterine fibroids. Patient initially seen in consultation on 04/05/2014 and has elected to undergo uterine fibroid embolization secondary to extensive menorrhagia.  EXAM: Uterine Fibroid Embolization  COMPARISON:  Contrast enhanced pelvic MRI - 04/09/2014  MEDICATIONS: Fentanyl 400 mcg IV; Versed 6 mg IV; Toradol 30 mg IV: Ancef 2 gm IV; The antibiotic was administered within one hour of the procedure  ANESTHESIA/SEDATION: Sedation time  120 minutes  CONTRAST:  4m OMNIPAQUE IOHEXOL 300 MG/ML  SOLN  FLUOROSCOPY TIME:  33 minutes; 54 seconds.  (27,026mGy)  COMPLICATIONS: None immediate  PROCEDURE: Informed consent was obtained from  the patient following explanation of the procedure, risks, benefits and alternatives. The patient understands, agrees and consents for the procedure. All questions were addressed. A time out was performed prior to the initiation of the procedure. Maximal barrier sterile technique utilized including caps, mask, sterile gowns, sterile gloves, large sterile drape, hand hygiene, and Betadine prep.  The right femoral head  was marked fluoroscopically. Under sterile conditions and local anesthesia, the right common femoral artery access was performed with a micropuncture needle. Under direct ultrasound guidance, the right common femoral was accessed with a micropuncture kit. An ultrasound image was saved for documentation purposes. This allowed for placement of a 5-French vascular sheath. A limited arteriogram was performed through the side arm of the sheath confirming appropriate access within the right common femoral artery.  A C2 catheter was utilized to select the contralateral left internal iliac artery. Selective left internal iliac angiogram was performed. The tortuous left uterine artery was identified. Selective catheterization was performed of the left uterine artery with a microcatheter and micro guide wire. A selective left uterine angiogram was performed. This demonstrated patency of the left uterine artery. Mild diffuse hypervascularity of the enlarged fibroid uterus. Access was adequate for embolization. For embolization, 3 vials of 500 - 700 micron and 1 vial of 700 - 900 Embospheres were injected into the left uterine artery. Post embolization angiogram confirms complete stasis of the left uterine vascular territory. Microcatheter was removed.  The C2 catheter was retracted and utilized to select the ipsilateral right internal iliac artery. Selective right internal iliac angiogram was performed. The patent right uterine artery was identified. For selective catheterization, the micro catheter and guidewire were utilized to select the right uterine artery. Selective right uterine angiogram was performed. This demonstrated patency of the right uterine artery. Catheter position was safe for embolization. Embolization was performed to complete stasis with injection of 3 vials of 500-700 micron and 3 vials of 700 - 900 Embospheres. Post embolization angiogram confirms complete stasis of the right uterine vascular territory.   At this point, all wires, catheters and sheaths were removed from the patient. Hemostasis was achieved at the right groin access site with  The patient tolerated the procedure well without immediate post procedural complication.  FINDINGS: Left internal iliac arteriogram demonstrates expected hypertrophy of the tortuous left uterine artery supplying an enlarged and myomatous uterus. Post embolization angiogram confirms complete stasis of the left uterine vascular territory.  Right internal iliac arteriogram demonstrates expected hypertrophy of the tortuous right uterine artery supplying an enlarged and myomatous uterus. Post embolization angiogram confirms complete stasis of the right uterine vascular territory.  IMPRESSION: Technically successful bilateral uterine artery embolization (U F E).  PLAN: The patient will be seen in follow-up consultation in approximately 1 month. Note, no imaging is necessary for this initial follow-up consultation.   Electronically Signed   By: Dawn Pope M.D.   On: 04/27/2014 15:35   Ir Embo Tumor Organ Ischemia Infarct Inc Guide Roadmapping  04/27/2014   INDICATION: Symptomatic uterine fibroids. Patient initially seen in consultation on 04/05/2014 and has elected to undergo uterine fibroid embolization secondary to extensive menorrhagia.  EXAM: Uterine Fibroid Embolization  COMPARISON:  Contrast enhanced pelvic MRI - 04/09/2014  MEDICATIONS: Fentanyl 400 mcg IV; Versed 6 mg IV; Toradol 30 mg IV: Ancef 2 gm IV; The antibiotic was administered within one hour of the procedure  ANESTHESIA/SEDATION: Sedation time  120 minutes  CONTRAST:  58m OMNIPAQUE IOHEXOL 300 MG/ML  SOLN  FLUOROSCOPY TIME:  33 minutes; 54 seconds.  (6,333 mGy)  COMPLICATIONS: None immediate  PROCEDURE: Informed consent was obtained from the patient following explanation of the procedure, risks, benefits and alternatives. The patient understands, agrees and consents for the procedure. All questions were  addressed. A time out was performed prior to the initiation of the procedure. Maximal barrier sterile technique utilized including caps, mask, sterile gowns, sterile gloves, large sterile drape, hand hygiene, and Betadine prep.  The right femoral head was marked fluoroscopically. Under sterile conditions and local anesthesia, the right common femoral artery access was performed with a micropuncture needle. Under direct ultrasound guidance, the right common femoral was accessed with a micropuncture kit. An ultrasound image was saved for documentation purposes. This allowed for placement of a 5-French vascular sheath. A limited arteriogram was performed through the side arm of the sheath confirming appropriate access within the right common femoral artery.  A C2 catheter was utilized to select the contralateral left internal iliac artery. Selective left internal iliac angiogram was performed. The tortuous left uterine artery was identified. Selective catheterization was performed of the left uterine artery with a microcatheter and micro guide wire. A selective left uterine angiogram was performed. This demonstrated patency of the left uterine artery. Mild diffuse hypervascularity of the enlarged fibroid uterus. Access was adequate for embolization. For embolization, 3 vials of 500 - 700 micron and 1 vial of 700 - 900 Embospheres were injected into the left uterine artery. Post embolization angiogram confirms complete stasis of the left uterine vascular territory. Microcatheter was removed.  The C2 catheter was retracted and utilized to select the ipsilateral right internal iliac artery. Selective right internal iliac angiogram was performed. The patent right uterine artery was identified. For selective catheterization, the micro catheter and guidewire were utilized to select the right uterine artery. Selective right uterine angiogram was performed. This demonstrated patency of the right uterine artery. Catheter  position was safe for embolization. Embolization was performed to complete stasis with injection of 3 vials of 500-700 micron and 3 vials of 700 - 900 Embospheres. Post embolization angiogram confirms complete stasis of the right uterine vascular territory.  At this point, all wires, catheters and sheaths were removed from the patient. Hemostasis was achieved at the right groin access site with  The patient tolerated the procedure well without immediate post procedural complication.  FINDINGS: Left internal iliac arteriogram demonstrates expected hypertrophy of the tortuous left uterine artery supplying an enlarged and myomatous uterus. Post embolization angiogram confirms complete stasis of the left uterine vascular territory.  Right internal iliac arteriogram demonstrates expected hypertrophy of the tortuous right uterine artery supplying an enlarged and myomatous uterus. Post embolization angiogram confirms complete stasis of the right uterine vascular territory.  IMPRESSION: Technically successful bilateral uterine artery embolization (U F E).  PLAN: The patient will be seen in follow-up consultation in approximately 1 month. Note, no imaging is necessary for this initial follow-up consultation.   Electronically Signed   By: Dawn Pope M.D.   On: 04/27/2014 15:35     Discharge Exam: Blood pressure 147/85, pulse 80, temperature 97.5 F (36.4 C), temperature source Oral, resp. rate 13, height 5' 3"  (1.6 m), weight 139 lb 11.2 oz (63.368 kg), last menstrual period 04/13/2014, SpO2 100 %. Patient is awake and alert. Chest is clear to auscultation bilaterally. Heart with regular rate and rhythm. Abdomen soft, positive bowel sounds, fibroid uterus, no significant tenderness to palpation. Right common femoral artery puncture site clean, dry, nontender, no hematoma. Extremities with  full range of motion, intact distal pulses, no edema.  Disposition: home  Discharge Instructions    Call MD for:  difficulty  breathing, headache or visual disturbances    Complete by:  As directed      Call MD for:  extreme fatigue    Complete by:  As directed      Call MD for:  hives    Complete by:  As directed      Call MD for:  persistant dizziness or light-headedness    Complete by:  As directed      Call MD for:  persistant nausea and vomiting    Complete by:  As directed      Call MD for:  redness, tenderness, or signs of infection (pain, swelling, redness, odor or green/yellow discharge around incision site)    Complete by:  As directed      Call MD for:  severe uncontrolled pain    Complete by:  As directed      Call MD for:  temperature >100.4    Complete by:  As directed      Change dressing (specify)    Complete by:  As directed   May keep bandaid on puncture site right groin for next 2-3 days; may wash site with soap and water     Diet - low sodium heart healthy    Complete by:  As directed      Driving Restrictions    Complete by:  As directed   No driving for next 24-09 hours     Increase activity slowly    Complete by:  As directed      Lifting restrictions    Complete by:  As directed   No heavy lifting for next 3-4 days     May shower / Bathe    Complete by:  As directed      May walk up steps    Complete by:  As directed      Sexual Activity Restrictions    Complete by:  As directed   No sexual intercourse for 1 week            Medication List    TAKE these medications        calcium & magnesium carbonates 311-232 MG per tablet  Commonly known as:  MYLANTA  Take 2 tablets by mouth every evening.     ferrous fumarate 325 (106 FE) MG Tabs tablet  Commonly known as:  HEMOCYTE - 106 mg FE  Take 1 tablet by mouth every evening.     ibuprofen 200 MG tablet  Commonly known as:  ADVIL,MOTRIN  Take 400 mg by mouth every 6 (six) hours as needed for headache or cramping.     lisinopril 40 MG tablet  Commonly known as:  PRINIVIL,ZESTRIL  Take 40 mg by mouth every morning.      lisinopril 40 MG tablet  Commonly known as:  PRINIVIL,ZESTRIL  TAKE ONE TABLET BY MOUTH ONCE DAILY     Vitamin D-3 5000 UNITS Tabs  Take 1 tablet by mouth every evening.           Follow-up Information    Follow up with Dawn Mariscal, MD.   Specialty:  Interventional Radiology   Why:  radiology will call you with follow up appt with Dr. Pascal Pope in 1 month; call [phone] or [phone] with any questions   Contact information:   [address] [place] [address] [phone]  Follow up with Darlyn Chamber, MD.   Specialty:  Obstetrics and Gynecology   Why:  continue care with Dr. Radene Knee as scheduled   Contact information:   [address], [address] [place], [place] [address] [place]  [address] [phone]       Signed:  Rowe Robert, [facility] 04/28/2014, 9:17 AM

## 2014-04-28 NOTE — Plan of Care (Signed)
Problem: Consults Goal: General Surgical Patient Education (See Patient Education module for education specifics)  Outcome: Completed/Met Date Met:  04/28/14 Goal: Skin Care Protocol Initiated - if Braden Score 18 or less If consults are not indicated, leave blank or document N/A  Outcome: Not Applicable Date Met:  79/39/68 Goal: Nutrition Consult-if indicated Outcome: Not Applicable Date Met:  86/48/47 Goal: Diabetes Guidelines if Diabetic/Glucose > 140 If diabetic or lab glucose is > 140 mg/dl - Initiate Diabetes/Hyperglycemia Guidelines & Document Interventions  Outcome: Not Applicable Date Met:  20/72/18  Problem: Phase I Progression Outcomes Goal: Pain controlled with appropriate interventions Outcome: Completed/Met Date Met:  04/28/14 Goal: OOB as tolerated unless otherwise ordered Outcome: Completed/Met Date Met:  04/28/14 Goal: Incision/dressings dry and intact Outcome: Completed/Met Date Met:  04/28/14 Goal: Initial discharge plan identified Outcome: Completed/Met Date Met:  04/28/14 Goal: Voiding-avoid urinary catheter unless indicated Outcome: Completed/Met Date Met:  04/28/14 Goal: Vital signs/hemodynamically stable Outcome: Completed/Met Date Met:  04/28/14 Goal: Other Phase I Outcomes/Goals Outcome: Not Applicable Date Met:  28/83/37  Problem: Phase II Progression Outcomes Goal: Pain controlled Outcome: Completed/Met Date Met:  04/28/14 Goal: Progress activity as tolerated unless otherwise ordered Outcome: Completed/Met Date Met:  04/28/14 Goal: Surgical site without signs of infection Outcome: Completed/Met Date Met:  04/28/14 Goal: Dressings dry/intact Outcome: Completed/Met Date Met:  04/28/14 Goal: Return of bowel function (flatus, BM) IF ABDOMINAL SURGERY:  Outcome: Completed/Met Date Met:  04/28/14 Goal: Foley discontinued Outcome: Completed/Met Date Met:  04/28/14 Goal: Discharge plan established Outcome: Completed/Met Date Met:  04/28/14 Goal:  Tolerating diet Outcome: Completed/Met Date Met:  04/28/14 Tolerating regular food. Goal: Other Phase II Outcomes/Goals Outcome: Not Applicable Date Met:  44/51/46  Problem: Phase III Progression Outcomes Goal: Pain controlled on oral analgesia Outcome: Completed/Met Date Met:  04/28/14 Goal: Activity at appropriate level-compared to baseline (UP IN CHAIR FOR HEMODIALYSIS)  Outcome: Completed/Met Date Met:  04/28/14 Patient ambulated in hallway without difficulty. Goal: Voiding independently Outcome: Completed/Met Date Met:  04/28/14 Voiding without difficulty since foley removed. Goal: IV changed to normal saline lock Outcome: Completed/Met Date Met:  04/28/14 Goal: Nasogastric tube discontinued Outcome: Not Applicable Date Met:  04/79/98 Goal: Discharge plan remains appropriate-arrangements made Outcome: Completed/Met Date Met:  04/28/14 Goal: Demonstrates TCDB, IS independently Outcome: Completed/Met Date Met:  04/28/14 Goal: Other Phase III Outcomes/Goals Outcome: Not Applicable Date Met:  72/15/87  Problem: Discharge Progression Outcomes Goal: Barriers To Progression Addressed/Resolved Outcome: Completed/Met Date Met:  04/28/14 Goal: Discharge plan in place and appropriate Outcome: Completed/Met Date Met:  04/28/14 Goal: Pain controlled with appropriate interventions Outcome: Completed/Met Date Met:  04/28/14 Goal: Hemodynamically stable Outcome: Completed/Met Date Met:  27/61/84 Goal: Complications resolved/controlled Outcome: Completed/Met Date Met:  04/28/14 Goal: Tolerating diet Outcome: Completed/Met Date Met:  04/28/14 Goal: Activity appropriate for discharge plan Outcome: Completed/Met Date Met:  04/28/14 Goal: Other Discharge Outcomes/Goals Outcome: Not Applicable Date Met:  85/92/76

## 2014-04-28 NOTE — Progress Notes (Signed)
UR completed 

## 2014-05-11 ENCOUNTER — Other Ambulatory Visit: Payer: Self-pay | Admitting: Physician Assistant

## 2014-05-11 ENCOUNTER — Other Ambulatory Visit: Payer: Self-pay

## 2014-05-11 MED ORDER — ENALAPRIL MALEATE 20 MG PO TABS: 20.0000 mg | ORAL_TABLET | Freq: Every day | ORAL | Status: DC

## 2014-05-23 ENCOUNTER — Other Ambulatory Visit: Payer: Self-pay

## 2014-05-23 MED ORDER — BENAZEPRIL HCL 20 MG PO TABS: 20.0000 mg | ORAL_TABLET | Freq: Every day | ORAL | Status: DC

## 2014-05-30 ENCOUNTER — Encounter: Payer: Self-pay | Admitting: Radiology

## 2014-07-04 ENCOUNTER — Ambulatory Visit
Admission: RE | Admit: 2014-07-04 | Discharge: 2014-07-04 | Disposition: A | Discharge status: Home / Self Care | Payer: 59 | Source: Ambulatory Visit | Attending: Radiology | Admitting: Radiology

## 2014-07-04 DIAGNOSIS — D259 Leiomyoma of uterus, unspecified: Secondary | ICD-10-CM

## 2014-07-04 HISTORY — DX: Benign neoplasm of connective and other soft tissue, unspecified: D21.9

## 2014-07-04 HISTORY — PX: IR GENERIC HISTORICAL: IMG1180011

## 2014-07-04 NOTE — Progress Notes (Signed)
Patient ID: Dawn Pope, female   DOB: 1970-09-09, 44 y.o.   MRN: [id number]   Chief Complaint: Chief Complaint  Patient presents with  . Follow-up    2 mo follow up [place] for symptomatic fibroids    Referring Physician(s): Dr. Radene Knee  History of Present Illness: Dawn Pope is a 44 y.o. female with past medical history significant for symptomatic uterine fibroids and hypertension who returns to interventional radiology clinic for follow-up evaluation post uterine fibroid embolization performed 04/27/2014. She is unaccompanied and serves as her own historian.  Patient was initially seen in consultation at the interventional radiology clinic on 04/05/2014. At presentation, the patient reported a long history of menorrhagia which had worsened the previous 3 months prior to presentation.  At presentation, the patient complained of severe menorrhagia with the passage of large clots necessitating the changing of pads and tampons every 3-4 hours. She also complained of bulk-like symptoms including transient pelvic and right lower quadrant abdominal pain and worsening urinary frequency. For the above fibroid related complaints, the patient underwent a technically successful bilateral uterine artery embolization on 04/27/2014. The patient recovered uneventfully from this procedure and has since returned to all preprocedural activities of daily living.  Since undergoing the fibroid embolization, the patient has experienced 2 menstrual cycles both of which have been associated with significant improvement in her preprocedural menorrhagia. She reports resolution of the passage of clots and marked improved menorrhagia. She also reports decreased bulk-like symptoms with decreased pelvic and lower abdominal cramping, urinary frequency and urgency. Patient reports minimal intermittent spotting though denies foul-smelling discharge. No fever or chills. No flank pain.  She is overall pleased with the  procedure.   Past Medical History  Diagnosis Date  . Hypertension   . Vitamin D deficiency   . Anemia   . Pulmonary hypertension Via Echo 2010    EF 65% RVSP 45 (Dr. Stanford Breed)  . Hypokalemia   . Hypomagnesemia   . Fibroids     Past Surgical History  Procedure Laterality Date  . No past surgeries    . Uterine artery embolization  04/27/2014    Allergies: Review of patient's allergies indicates no known allergies.  Medications: Prior to Admission medications   Medication Sig Start Date End Date Taking? Authorizing Provider  benazepril (LOTENSIN) 20 MG tablet Take 1 tablet (20 mg total) by mouth daily. 05/23/14 05/23/15  Vicie Mutters, PA-C  calcium & magnesium carbonates (MYLANTA) 311-232 MG per tablet Take 2 tablets by mouth every evening.     Historical Provider, MD  Cholecalciferol (VITAMIN D-3) 5000 UNITS TABS Take 1 tablet by mouth every evening.     Historical Provider, MD  ferrous fumarate (HEMOCYTE - 106 MG FE) 325 (106 FE) MG TABS tablet Take 1 tablet by mouth every evening.     Historical Provider, MD  ibuprofen (ADVIL,MOTRIN) 200 MG tablet Take 400 mg by mouth every 6 (six) hours as needed for headache or cramping.    Historical Provider, MD    Family History  Problem Relation Age of Onset  . Hypertension Mother   . Hyperlipidemia Mother   . Hyperlipidemia Father   . Hypertension Father     History   Social History  . Marital Status: Single    Spouse Name: N/A    Number of Children: 2  . Years of Education: N/A   Occupational History  . Glass blower/designer    Social History Main Topics  . Smoking status: Never Smoker   .  Smokeless tobacco: Never Used  . Alcohol Use: No  . Drug Use: No  . Sexual Activity: Not on file   Other Topics Concern  . Not on file   Social History Narrative    ECOG Status: 0 - Asymptomatic  Review of Systems: A 12 point ROS discussed and pertinent positives are indicated in the HPI above.  All other systems are  negative.  Review of Systems  Constitutional: Negative.   Gastrointestinal: Negative.  Negative for nausea, abdominal pain, diarrhea, blood in stool and abdominal distention.  Genitourinary: Negative for dysuria, urgency and frequency.  Psychiatric/Behavioral: Negative.     Vital Signs: BP 159/93 mmHg  Pulse 86  Temp(Src) 98.3 F (36.8 C) (Oral)  Resp 14  SpO2 100%  LMP 06/27/2014 (Approximate)  Physical Exam  Constitutional: She appears well-developed and well-nourished.  Abdominal: Soft. Bowel sounds are normal. She exhibits no distension. There is no tenderness.  I am able to palpate the uterine fundus at the level of the umbilicus.  Psychiatric: She has a normal mood and affect. Her behavior is normal.  Nursing note and vitals reviewed.   Imaging:  Contrast enhanced pelvic MRI - 04/09/2014; angiographic images from bilateral uterine artery embolization-04/27/2014  Selected images from preprocedural pelvic MRI performed 04/09/2014 as well as intraoperative angiographic images from bilateral uterine artery embolization performed 04/27/2014 reviewed in detail with the patient.  Labs:  CBC:  Recent Labs  12/19/13 1714 01/22/14 1625 03/12/14 0946 04/27/14 1045  WBC 6.7 5.9 6.0 5.0  HGB 9.3* 10.6* 11.2* 11.4*  HCT 28.9* 32.8* 33.5* 34.6*  PLT 340 269 407* 281    COAGS:  Recent Labs  04/27/14 1045  INR 1.01    BMP:  Recent Labs  12/19/13 1714 01/22/14 1625 03/12/14 0946 04/27/14 1045  NA 134* 139 138 137  K 3.4* 3.7 4.2 3.3*  CL 97 105 102 98  CO2 29 24 25 24   GLUCOSE 83 102* 87 100*  BUN 12 15 8 12   CALCIUM 9.7 9.3 9.3 9.5  CREATININE 0.68 0.68 0.65 0.70  GFRNONAA >89 >89 >89 >90  GFRAA >89 >89 >89 >90    LIVER FUNCTION TESTS:  Recent Labs  12/19/13 1714 03/12/14 0946  BILITOT 0.5 0.4  AST 17 12  ALT 10 <8  ALKPHOS 59 78  PROT 7.8 7.5  ALBUMIN 4.3 4.0   Assessment and Plan:  Dawn Pope is a 44 y.o. female with past medical  history significant for symptomatic uterine fibroids and hypertension who returns to interventional radiology clinic for follow-up evaluation post uterine fibroid embolization performed 04/27/2014. The patient recovered uneventfully from this procedure and has since returned to all preprocedural activities of daily living.  Since undergoing the fibroid embolization, the patient reports significant improvement in her preprocedural menorrhagia. She reports resolution of the passage of clots and marked improved menorrhagia. She also reports decreased bulk-like symptoms with decreased pelvic and lower abdominal cramping, urinary frequency and urgency. She is overall pleased with the procedure.  The patient will be contacted in the next 2-3 months to ensure continued symptomatic improvement. At that time, a 6 month follow-up consultation will be arranged (June 2016).  A post procedural pelvic MRI will only be obtained in the setting of recurrent or persistent fibroid related symptomology.  In the interval, patient was encouraged to call the interventional radiology clinic with any additional questions or concerns.  SignedSandi Mariscal 07/04/2014, 9:35 AM   I spent a total of 25 minutes face to  face in clinical consultation, greater than 50% of which was counseling/coordinating care for symptomatic uterine fibroids.

## 2014-07-04 NOTE — Progress Notes (Signed)
LMP:  06/27/2014.  Length:  5 days.    Patient notes decreased menstrual flow, decreased clots.  Minimal cramping & taking Ibuprofen as needed for menstrual cramps.  Improvement re:  Urinary frequency, nocturia and decreased bloating.  Overall, patient is doing well.  Vallerie Hentz Riki Rusk, [place] 07/04/2014 9:28 AM

## 2014-09-19 ENCOUNTER — Other Ambulatory Visit (HOSPITAL_COMMUNITY): Payer: Self-pay | Admitting: Interventional Radiology

## 2014-09-19 DIAGNOSIS — D259 Leiomyoma of uterus, unspecified: Secondary | ICD-10-CM

## 2014-11-03 ENCOUNTER — Other Ambulatory Visit: Payer: Self-pay | Admitting: Physician Assistant

## 2014-11-06 ENCOUNTER — Other Ambulatory Visit: Payer: Self-pay | Admitting: Physician Assistant

## 2014-11-07 ENCOUNTER — Other Ambulatory Visit: Payer: Self-pay | Admitting: Physician Assistant

## 2015-03-13 ENCOUNTER — Encounter: Payer: Self-pay | Admitting: Physician Assistant

## 2015-07-26 IMAGING — XA IR EMBO TUMOR ORGAN ISCHEMIA INFARCT INC GUIDE ROADMAPPING
9 series · 12 of 24 positions shown · IV contrast (IODINE)
Comparison: Contrast enhanced pelvic MRI - 04/09/2014

INDICATION: Symptomatic uterine fibroids. Patient initially seen in consultation
on 04/05/2014 and has elected to undergo uterine fibroid
embolization secondary to extensive menorrhagia.

EXAM:
Uterine Fibroid Embolization

[Series 3: body 4 · 1 of 24 frames shown (1 of 8)]
[frame 13/24]
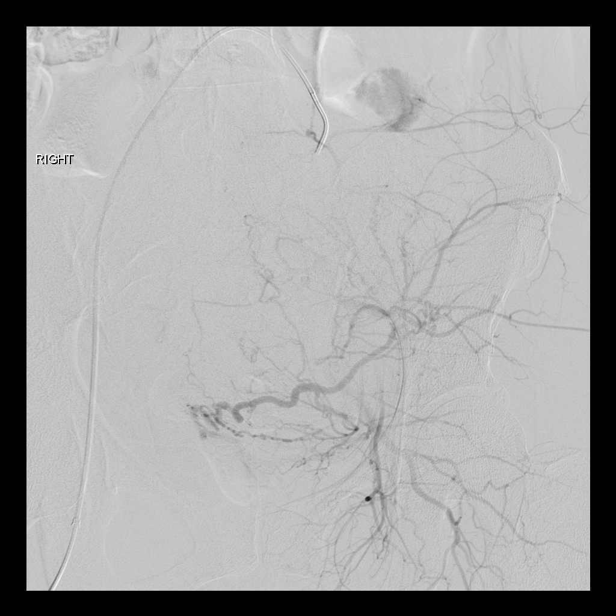

[Series 6: body 4 · 1 of 29 frames shown (2 of 8)]
[frame 15/29]
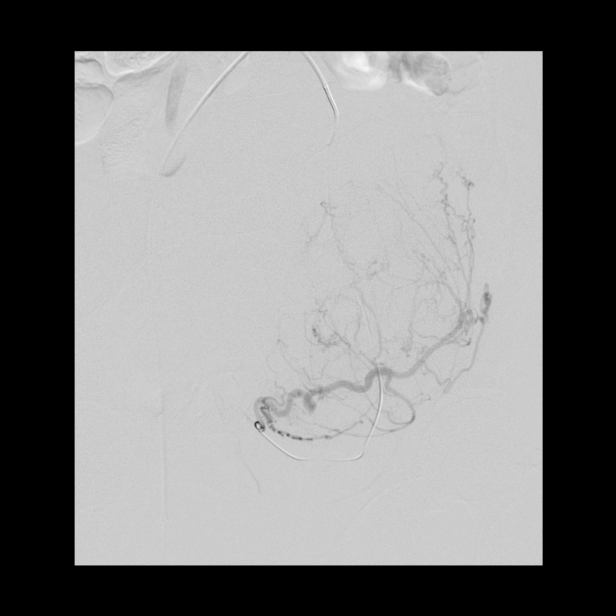

[Series 9: body 4 · 1 of 23 frames shown (3 of 8)]
[frame 4/23]
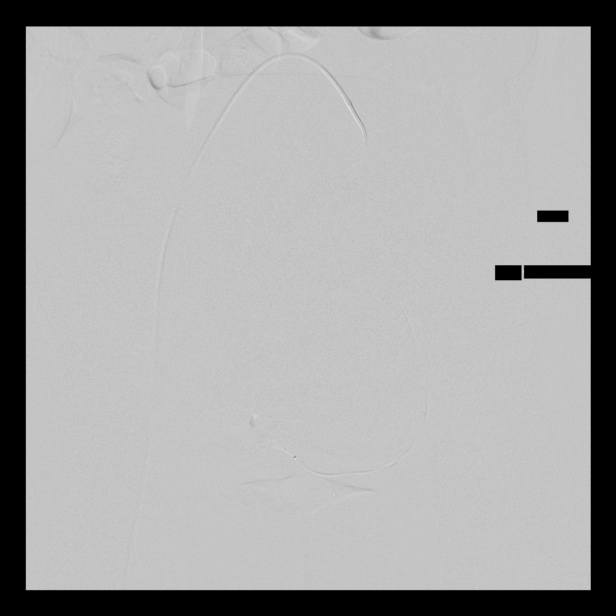

[Series 10: body 4 · 1 of 16 frames shown (4 of 8)]
[frame 9/16]
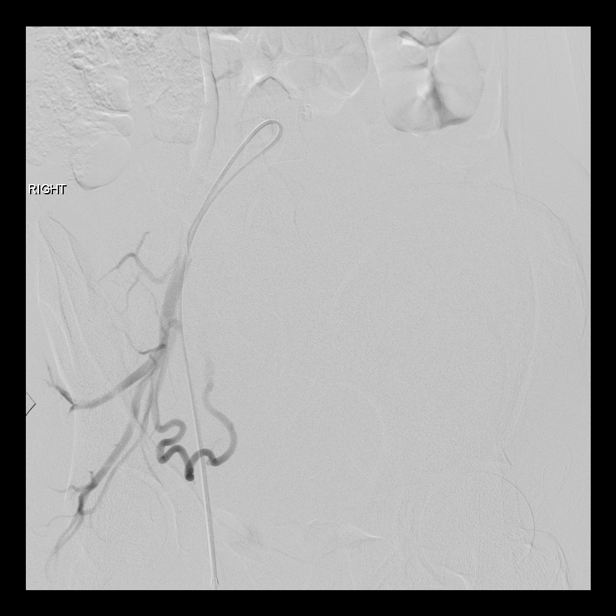

[Series 11: body 4 · 1 of 27 frames shown (5 of 8)]
[frame 5/27]
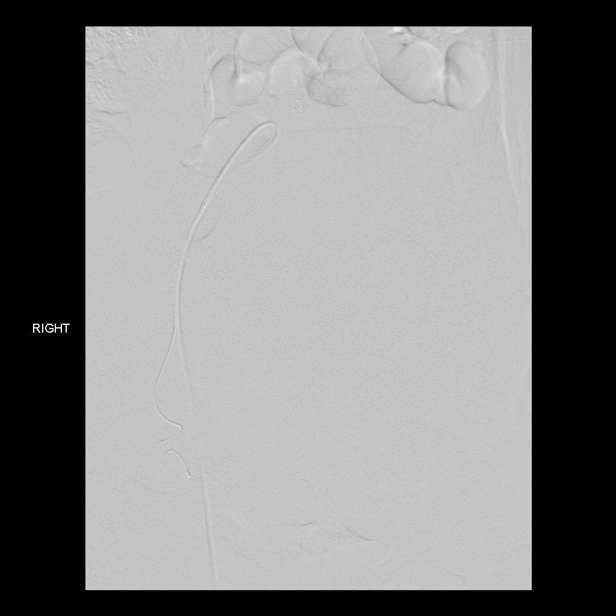

[Series 12: body 4 · 2 of 27 frames shown (6 of 8)]
[frame 5/27]
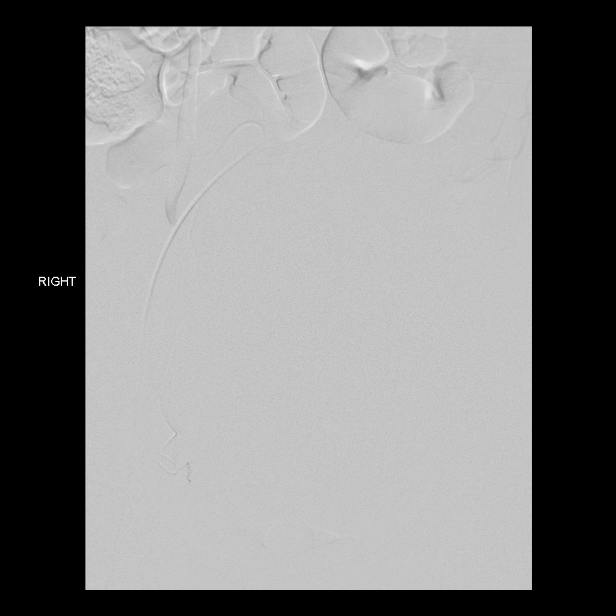
[frame 25/27]
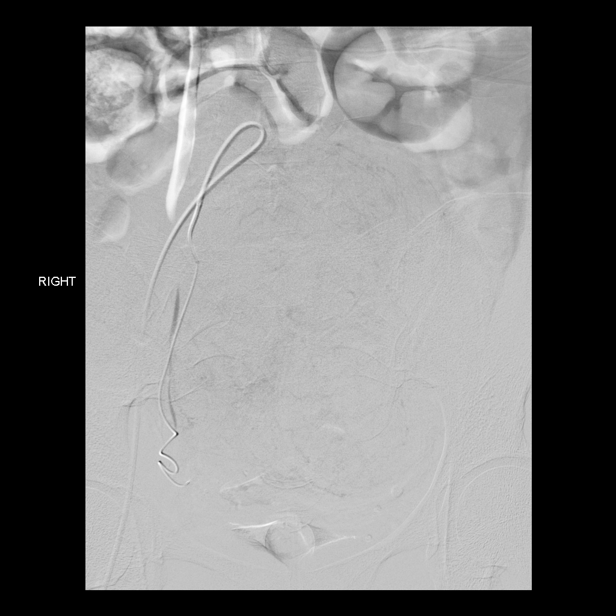

[Series 14: body 4 · 1 of 18 frames shown (7 of 8)]
[frame 16/18]
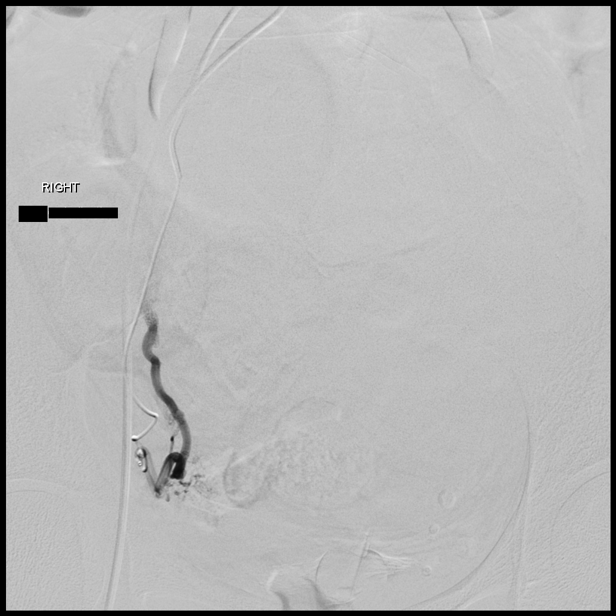

[Series 15: body 4 · 1 of 24 frames shown (8 of 8)]
[frame 13/24]
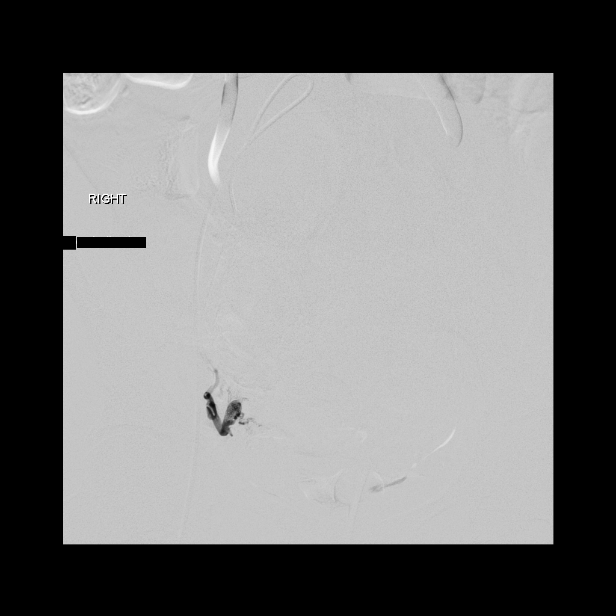

[Series 300: dsa body · 3 of 9 slices shown]
[im 2/9]
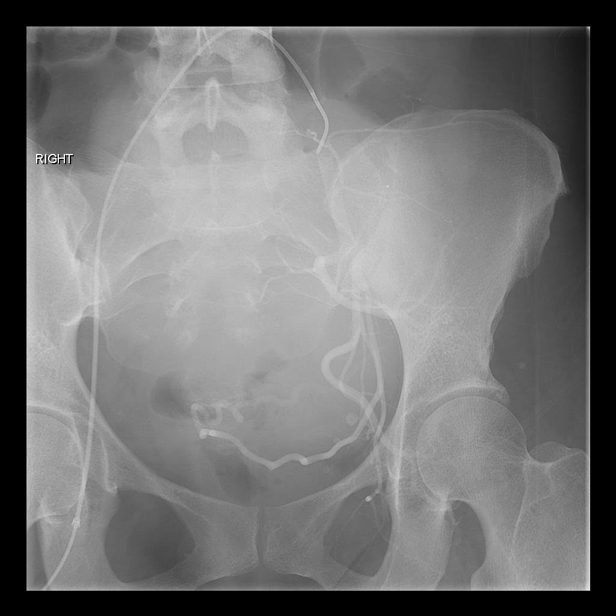
[im 6/9]
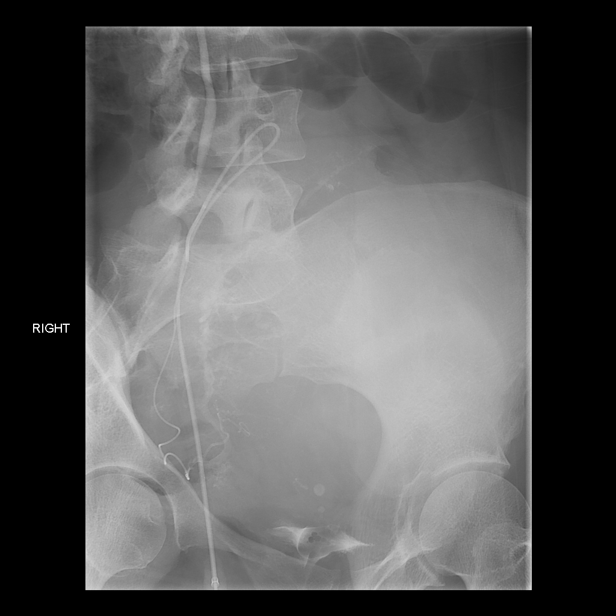
[im 9/9]
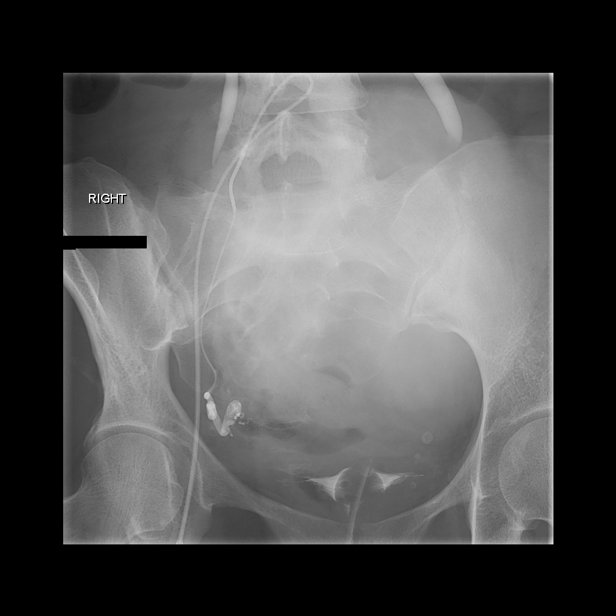

[12 of 24 positions shown; findings below may reference images not displayed]

MEDICATIONS:
Fentanyl 400 mcg IV; Versed 6 mg IV; Toradol 30 mg IV: Ancef 2 gm
IV; The antibiotic was administered within one hour of the procedure

ANESTHESIA/SEDATION:
Sedation time

120 minutes

CONTRAST:  80mL OMNIPAQUE IOHEXOL 300 MG/ML  SOLN

FLUOROSCOPY TIME:  33 minutes; 54 seconds.  (2,277 mGy)

COMPLICATIONS:
None immediate



The right femoral head was marked fluoroscopically. Under sterile
conditions and local anesthesia, the right common femoral artery
access was performed with a micropuncture needle. Under direct
ultrasound guidance, the right common femoral was accessed with a
micropuncture kit. An ultrasound image was saved for documentation
purposes. This allowed for placement of a 5-French vascular sheath.
A limited arteriogram was performed through the side arm of the
sheath confirming appropriate access within the right common femoral
artery.

A C2 catheter was utilized to select the contralateral left internal
iliac artery. Selective left internal iliac angiogram was performed.
The tortuous left uterine artery was identified. Selective
catheterization was performed of the left uterine artery with a
microcatheter and micro guide wire. A selective left uterine
angiogram was performed. This demonstrated patency of the left
uterine artery. Mild diffuse hypervascularity of the enlarged
fibroid uterus. Access was adequate for embolization. For
embolization, 3 vials of 500 - 700 micron and 1 vial of 700 - 900
Embospheres were injected into the left uterine artery. Post
embolization angiogram confirms complete stasis of the left uterine
vascular territory. Microcatheter was removed.

The C2 catheter was retracted and utilized to select the ipsilateral
right internal iliac artery. Selective right internal iliac
angiogram was performed. The patent right uterine artery was
identified. For selective catheterization, the micro catheter and
guidewire were utilized to select the right uterine artery.
Selective right uterine angiogram was performed. This demonstrated
patency of the right uterine artery. Catheter position was safe for
embolization. Embolization was performed to complete stasis with
injection of 3 vials of 500-700 micron and 3 vials of 700 - 900
Embospheres. Post embolization angiogram confirms complete stasis of
the right uterine vascular territory.

At this point, all wires, catheters and sheaths were removed from
the patient. Hemostasis was achieved at the right groin access site
with

The patient tolerated the procedure well without immediate post
procedural complication.
FINDINGS: Left internal iliac arteriogram demonstrates expected hypertrophy of
the tortuous left uterine artery supplying an enlarged and myomatous
uterus. Post embolization angiogram confirms complete stasis of the
left uterine vascular territory.

Right internal iliac arteriogram demonstrates expected hypertrophy
of the tortuous right uterine artery supplying an enlarged and
myomatous uterus. Post embolization angiogram confirms complete
stasis of the right uterine vascular territory.
IMPRESSION: Technically successful bilateral uterine artery embolization (U F
E).

PLAN:
The patient will be seen in follow-up consultation in approximately
1 month. Note, no imaging is necessary for this initial follow-up
consultation.

## 2015-08-22 ENCOUNTER — Ambulatory Visit (INDEPENDENT_AMBULATORY_CARE_PROVIDER_SITE_OTHER): Payer: Commercial Managed Care - PPO | Admitting: Internal Medicine

## 2015-08-22 ENCOUNTER — Encounter: Payer: Self-pay | Admitting: Internal Medicine

## 2015-08-22 VITALS — BP 128/76 | HR 92 | Temp 98.0°F | Resp 18 | Ht 63.5 in | Wt 136.0 lb

## 2015-08-22 DIAGNOSIS — I10 Essential (primary) hypertension: Secondary | ICD-10-CM

## 2015-08-22 DIAGNOSIS — D509 Iron deficiency anemia, unspecified: Secondary | ICD-10-CM

## 2015-08-22 DIAGNOSIS — M7520 Bicipital tendinitis, unspecified shoulder: Secondary | ICD-10-CM

## 2015-08-22 LAB — CBC WITH DIFFERENTIAL/PLATELET
Basophils Absolute: 0 10*3/uL (ref 0.0–0.1)
Basophils Relative: 0 % (ref 0–1)
Eosinophils Absolute: 0.1 10*3/uL (ref 0.0–0.7)
Eosinophils Relative: 2 % (ref 0–5)
HCT: 35.1 % — ABNORMAL LOW (ref 36.0–46.0)
Hemoglobin: 10.6 g/dL — ABNORMAL LOW (ref 12.0–15.0)
Lymphocytes Relative: 39 % (ref 12–46)
Lymphs Abs: 1.7 10*3/uL (ref 0.7–4.0)
MCH: 24 pg — ABNORMAL LOW (ref 26.0–34.0)
MCHC: 30.2 g/dL (ref 30.0–36.0)
MCV: 79.6 fL (ref 78.0–100.0)
MPV: 10.6 fL (ref 8.6–12.4)
Monocytes Absolute: 0.4 10*3/uL (ref 0.1–1.0)
Monocytes Relative: 9 % (ref 3–12)
Neutro Abs: 2.2 10*3/uL (ref 1.7–7.7)
Neutrophils Relative %: 50 % (ref 43–77)
Platelets: 324 10*3/uL (ref 150–400)
RBC: 4.41 MIL/uL (ref 3.87–5.11)
RDW: 15.4 % (ref 11.5–15.5)
WBC: 4.3 10*3/uL (ref 4.0–10.5)

## 2015-08-22 MED ORDER — BENAZEPRIL HCL 20 MG PO TABS: 20.0000 mg | ORAL_TABLET | Freq: Every day | ORAL | Status: DC

## 2015-08-22 MED ORDER — MELOXICAM 15 MG PO TABS: 15.0000 mg | ORAL_TABLET | Freq: Every day | ORAL | Status: DC

## 2015-08-22 NOTE — Patient Instructions (Signed)
Carpal Tunnel Syndrome Carpal tunnel syndrome is a condition that causes pain in your hand and arm. The carpal tunnel is a narrow area located on the palm side of your wrist. Repeated wrist motion or certain diseases may cause swelling within the tunnel. This swelling pinches the main nerve in the wrist (median nerve). CAUSES  This condition may be caused by:   Repeated wrist motions.  Wrist injuries.  Arthritis.  A cyst or tumor in the carpal tunnel.  Fluid buildup during pregnancy. Sometimes the cause of this condition is not known.  RISK FACTORS This condition is more likely to develop in:   People who have jobs that cause them to repeatedly move their wrists in the same motion, such as butchers and cashiers.  Women.  People with certain conditions, such as:  Diabetes.  Obesity.  An underactive thyroid (hypothyroidism).  Kidney failure. SYMPTOMS  Symptoms of this condition include:   A tingling feeling in your fingers, especially in your thumb, index, and middle fingers.  Tingling or numbness in your hand.  An aching feeling in your entire arm, especially when your wrist and elbow are bent for long periods of time.  Wrist pain that goes up your arm to your shoulder.  Pain that goes down into your palm or fingers.  A weak feeling in your hands. You may have trouble grabbing and holding items. Your symptoms may feel worse during the night.  DIAGNOSIS  This condition is diagnosed with a medical history and physical exam. You may also have tests, including:   An electromyogram (EMG). This test measures electrical signals sent by your nerves into the muscles.  X-rays. TREATMENT  Treatment for this condition includes:  Lifestyle changes. It is important to stop doing or modify the activity that caused your condition.  Physical or occupational therapy.  Medicines for pain and inflammation. This may include medicine that is injected into your wrist.  A wrist  splint.  Surgery. HOME CARE INSTRUCTIONS  If You Have a Splint:  Wear it as told by your health care provider. Remove it only as told by your health care provider.  Loosen the splint if your fingers become numb and tingle, or if they turn cold and blue.  Keep the splint clean and dry. General Instructions  Take over-the-counter and prescription medicines only as told by your health care provider.  Rest your wrist from any activity that may be causing your pain. If your condition is work related, talk to your employer about changes that can be made, such as getting a wrist pad to use while typing.  If directed, apply ice to the painful area:  Put ice in a plastic bag.  Place a towel between your skin and the bag.  Leave the ice on for 20 minutes, 2-3 times per day.  Keep all follow-up visits as told by your health care provider. This is important.  Do any exercises as told by your health care provider, physical therapist, or occupational therapist. [place] IF:   You have new symptoms.  Your pain is not controlled with medicines.  Your symptoms get worse.   This information is not intended to replace advice given to you by your health care provider. Make sure you discuss any questions you have with your health care provider.   Document Released: 05/15/2000 Document Revised: 02/06/2015 Document Reviewed: 10/03/2014 Elsevier Interactive Patient Education 2016 [employer]  Biceps Tendon Tendinitis (Distal) With Rehab Tendinitis involves inflammation and pain over  the affected tendon. The distal biceps tendon (near the elbow) is vulnerable to tendinitis. Distal biceps tendonitis is usually due to the bony bump near the elbow (bicipital tuberosity) causing increased friction over the tendon. The biceps tendon attaches the biceps muscle to one bone in the elbow and two in the shoulder. It is important for proper function of the elbow and for turning the palm upward  (supination). SYMPTOMS   Pain, aching, tenderness, and sometimes warmth or redness over the front of the elbow.  Pain when bending the elbow or turning the palm up, using the wrist, especially if performed against resistance.  Crackling sound (crepitation) when the tendon or elbow is moved or touched. CAUSES  The symptoms of biceps tendonitis are due to inflammation of the tendon. Inflammation may be caused by:  Strain from sudden increase in amount or intensity of activity.  Direct blow or injury to the elbow (uncommon).  Overuse or repetitive elbow bending or wrist rotation, particularly when turning the palm up, or with elbow hyperextension. RISK INCREASES WITH:  Sports that involve contact or overhead arm activity (throwing sports, gymnastics, weightlifting, bodybuilding, rock climbing).  Heavy labor.  Poor strength and flexibility.  Failure to warm up properly before activity.  Injury to other structures of the elbow.  Restraint of the elbow. PREVENTION  Warm up and stretch properly before activity.  Allow time for recovery between activities.  Maintain physical fitness:  Strength, flexibility, and endurance.  Cardiovascular fitness.  Learn and use proper exercise technique. PROGNOSIS  With proper treatment, biceps tendon tendonitis is usually curable within 6 weeks.  RELATED COMPLICATIONS   Longer healing time if not properly treated or if not given enough time to heal.  Chronically inflamed tendon that causes persistent pain with activity, that may progress to constant pain and potentially rupture of the tendon.  Recurring symptoms, especially if activity is resumed too soon, with overuse or with poor technique. TREATMENT  Treatment first involves ice and medicine to reduce pain and inflammation. Modify activities that cause pain, to reduce the chances of causing the condition to get worse. Strengthening and stretching exercises should be performed to  promote proper use of the muscles of the elbow. These exercise may be performed at home or with a therapist. Other treatments may be given such as ultrasound or heat therapy. Surgery is usually not recommended.  MEDICATION  If pain medicine is needed, nonsteroidal anti-inflammatory medicines (aspirin and ibuprofen), or other minor pain relievers (acetaminophen), are often advised.  Do not take pain relieving medication for 7 days before surgery.  Prescription pain relievers may be given if your caregiver thinks they are needed. Use only as directed and only as much as you need. HEAT AND COLD:   Cold treatment (icing) should be applied for 10 to 15 minutes every 2 to 3 hours for inflammation and pain, and immediately after activity that aggravates your symptoms. Use ice packs or an ice massage.  Heat treatment may be used before performing stretching and strengthening activities prescribed by your caregiver, physical therapist, or athletic trainer. Use a heat pack or a warm water soak. SEEK MEDICAL CARE IF:   Symptoms get worse or do not improve in 2 weeks, despite treatment.  New, unexplained symptoms develop. (Drugs used in treatment may produce side effects.) EXERCISES  RANGE OF MOTION (ROM) AND STRETCHING EXERCISES - Biceps Tendon Tendinitis (Distal) These exercises may help you when beginning to rehabilitate your injury. Your symptoms may go away with or without  further involvement from your physician, physical therapist, or athletic trainer. While completing these exercises, remember:   Restoring tissue flexibility helps normal motion to return to the joints. This allows healthier, less painful movement and activity.  An effective stretch should be held for at least 30 seconds.  A stretch should never be painful. You should only feel a gentle lengthening or release in the stretched tissue. STRETCH - Elbow Flexors   Lie on a firm bed or countertop on your back. Be sure that you are  in a comfortable position which will allow you to relax your arm muscles.  Place a folded towel under your right / left upper arm, so that your elbow and shoulder are at the same height. Extend your arm; your elbow should not rest on the bed or towel.  Allow the weight of your hand to straighten your elbow. Keep your arm and chest muscles relaxed. Your caretaker may ask you to increase the intensity of your stretch by adding a small wrist or hand weight.  Hold for __________ seconds. You should feel a stretch on the inside of your elbow. Slowly return to the starting position. Repeat __________ times. Complete this exercise __________ times per day. RANGE OF MOTION - Supination, Active   Stand or sit with your elbows at your side. Bend your right / left elbow to 90 degrees.  Turn your palm upward until you feel a gentle stretch on the inside of your forearm.  Hold this position for __________ seconds. Slowly release and return to the starting position. Repeat __________ times. Complete this stretch __________ times per day.  RANGE OF MOTION - Pronation, Active   Stand or sit with your elbows at your side. Bend your right / left elbow to 90 degrees.  Turn your palm downward until you feel a gentle stretch on the top of your forearm.  Hold this position for __________ seconds. Slowly release and return to the starting position. Repeat __________ times. Complete this stretch __________ times per day.  STRENGTHENING EXERCISES - Biceps Tendon Tendinitis (Distal) These exercises may help you when beginning to rehabilitate your injury. They may resolve your symptoms with or without further involvement from your physician, physical therapist or athletic trainer. While completing these exercises, remember:   Muscles can gain both the endurance and the strength needed for everyday activities through controlled exercises.  Complete these exercises as instructed by your physician, physical therapist  or athletic trainer. Increase the resistance and repetitions only as guided.  You may experience muscle soreness or fatigue, but the pain or discomfort you are trying to eliminate should never get worse during these exercises. If this pain does get worse, stop and make sure you are following the directions exactly. If the pain is still present after adjustments, discontinue the exercise until you can discuss the trouble with your clinician. STRENGTH - Elbow Flexors, Isometric   Stand or sit upright on a firm surface. Place your right / left arm so that your hand is palm-up and at the height of your waist.  Place your opposite hand on top of your forearm. Gently push down as your right / left arm resists. Push as hard as you can with both arms without causing any pain or movement at your right / left elbow. Hold this stationary position for __________ seconds.  Gradually release the tension in both arms. Allow your muscles to relax completely before repeating. Repeat __________ times. Complete this exercise __________ times per day. STRENGTH -  Forearm Supinators   Sit with your right / left forearm supported on a table, keeping your elbow below shoulder height. Rest your hand over the edge, palm down.  Gently grip a hammer or a soup ladle.  Without moving your elbow, slowly turn your palm and hand upward to a "thumbs-up" position.  Hold this position for __________ seconds. Slowly return to the starting position. Repeat __________ times. Complete this exercise __________ times per day.  STRENGTH - Forearm Pronators   Sit with your right / left forearm supported on a table, keeping your elbow below shoulder height. Rest your hand over the edge, palm up.  Gently grip a hammer or a soup ladle.  Without moving your elbow, slowly turn your palm and hand upward to a "thumbs-up" position.  Hold this position for __________ seconds. Slowly return to the starting position. Repeat __________  times. Complete this exercise __________ times per day.  STRENGTH - Elbow Flexors, Supinated  With good posture, stand or sit on a firm chair without armrests. Allow your right / left arm to rest at your side with your palm facing forward.  Holding a __________ weight, or gripping a rubber exercise band or tubing, bring your hand toward your shoulder.  Allow your muscles to control the resistance as your hand returns to your side. Repeat __________ times. Complete this exercise __________ times per day.  STRENGTH - Elbow Flexors, Neutral  With good posture, stand or sit on a firm chair without armrests. Allow your right / left arm to rest at your side with your thumb facing forward.  Holding a __________ weight, or gripping a rubber exercise band or tubing, bring your hand toward your shoulder.  Allow your muscles to control the resistance as your hand returns to your side. Repeat __________ times. Complete this exercise __________ times per day.    This information is not intended to replace advice given to you by your health care provider. Make sure you discuss any questions you have with your health care provider.   Document Released: 05/18/2005 Document Revised: 06/08/2014 Document Reviewed: 08/30/2008 Elsevier Interactive Patient Education [employer].

## 2015-08-22 NOTE — Progress Notes (Signed)
Subjective:    Patient ID: Dawn Pope, female    DOB: December 15, 1970, 45 y.o.   MRN: [id number]  HPI  Patient presents to the office for evaluation of myalgias and joint aches.  She reports that she has had problems with overall fatigue and left arm pain and numbness.  She is worried that she is having some issues with low iron.  She has had a history of low iron.  She reports that she did have a uterine fibroid and that is what she thinks is causing some of the body aches and the numbness.  She does take multivitamins.  She is right handed.  She does get some numbness on the right side but not as bad.  She does not take iron daily.  She does give blood frequently.  Last time she gave blood was in December and then she also gave in February this year.  No injury.  No history of injuries.     Review of Systems  Constitutional: Negative for fever and chills.  Gastrointestinal: Negative for nausea and vomiting.  Musculoskeletal: Positive for myalgias. Negative for back pain, joint swelling, neck pain and neck stiffness.  Neurological: Positive for numbness. Negative for dizziness, facial asymmetry and headaches.       Objective:   Physical Exam  Constitutional: She is oriented to person, place, and time. She appears well-developed and well-nourished. No distress.  HENT:  Head: Normocephalic.  Mouth/Throat: Oropharynx is clear and moist. No oropharyngeal exudate.  Eyes: Conjunctivae are normal. No scleral icterus.  Neck: Normal range of motion. Neck supple. No JVD present. No thyromegaly present.  Cardiovascular: Normal rate, regular rhythm, normal heart sounds and intact distal pulses.  Exam reveals no gallop and no friction rub.   No murmur heard. Pulmonary/Chest: Effort normal and breath sounds normal. No respiratory distress. She has no wheezes. She has no rales. She exhibits no tenderness.  Abdominal: Soft. Bowel sounds are normal. She exhibits no distension and no mass. There is no  tenderness. There is no rebound and no guarding.  Musculoskeletal:       Left shoulder: She exhibits normal range of motion, no tenderness, no bony tenderness, no swelling, no effusion, no crepitus, no deformity, no laceration, no pain, no spasm, normal pulse and normal strength.       Right wrist: Normal.       Left wrist: Normal.       Cervical back: Normal.       Arms: Negative bilateral spurling sign.  Negative bilateral tinnels, finklesteins, phalens testng.    Negative speeds test.  Negative empty can test.    Full ROM in all joints of the upper extremities.    Lymphadenopathy:    She has no cervical adenopathy.  Neurological: She is alert and oriented to person, place, and time. She has normal strength. No cranial nerve deficit or sensory deficit. Coordination normal.  Skin: Skin is warm and dry. She is not diaphoretic.  Psychiatric: She has a normal mood and affect. Her behavior is normal. Judgment and thought content normal.  Nursing note and vitals reviewed.   Filed Vitals:   08/22/15 0953  BP: 128/76  Pulse: 92  Temp: 98 F (36.7 C)  Resp: 18          Assessment & Plan:    1. Anemia, iron deficiency -doubt that this is cause of myalgias - CBC with Differential/Platelet  2. Biceps tendonitis, unspecified laterality -also possibly bilateral carpal tunnel as well -  most consistent with history and physical exam - meloxicam (MOBIC) 15 MG tablet; Take 1 tablet (15 mg total) by mouth daily.  Dispense: 90 tablet; Refill: 1  3. Essential hypertension -well controlled on current medication regimen - benazepril (LOTENSIN) 20 MG tablet; Take 1 tablet (20 mg total) by mouth daily.  Dispense: 90 tablet; Refill: 1

## 2015-10-03 ENCOUNTER — Encounter: Payer: Self-pay | Admitting: Internal Medicine

## 2015-10-07 ENCOUNTER — Encounter: Payer: Self-pay | Admitting: Physician Assistant

## 2015-10-07 ENCOUNTER — Ambulatory Visit (INDEPENDENT_AMBULATORY_CARE_PROVIDER_SITE_OTHER): Payer: Commercial Managed Care - PPO | Admitting: Physician Assistant

## 2015-10-07 VITALS — BP 100/60 | HR 78 | Temp 98.1°F | Resp 16 | Ht 64.0 in | Wt 135.6 lb

## 2015-10-07 DIAGNOSIS — D509 Iron deficiency anemia, unspecified: Secondary | ICD-10-CM

## 2015-10-07 DIAGNOSIS — E559 Vitamin D deficiency, unspecified: Secondary | ICD-10-CM

## 2015-10-07 DIAGNOSIS — Z0000 Encounter for general adult medical examination without abnormal findings: Secondary | ICD-10-CM | POA: Diagnosis not present

## 2015-10-07 DIAGNOSIS — Z136 Encounter for screening for cardiovascular disorders: Secondary | ICD-10-CM | POA: Diagnosis not present

## 2015-10-07 DIAGNOSIS — I272 Pulmonary hypertension, unspecified: Secondary | ICD-10-CM

## 2015-10-07 DIAGNOSIS — D259 Leiomyoma of uterus, unspecified: Secondary | ICD-10-CM

## 2015-10-07 DIAGNOSIS — E876 Hypokalemia: Secondary | ICD-10-CM

## 2015-10-07 DIAGNOSIS — Z131 Encounter for screening for diabetes mellitus: Secondary | ICD-10-CM

## 2015-10-07 DIAGNOSIS — I10 Essential (primary) hypertension: Secondary | ICD-10-CM

## 2015-10-07 DIAGNOSIS — Z13220 Encounter for screening for lipoid disorders: Secondary | ICD-10-CM

## 2015-10-07 LAB — CBC WITH DIFFERENTIAL/PLATELET
BASOS ABS: 0 {cells}/uL (ref 0–200)
Basophils Relative: 0 %
EOS ABS: 56 {cells}/uL (ref 15–500)
EOS PCT: 1 %
HCT: 37.4 % (ref 35.0–45.0)
Hemoglobin: 11.7 g/dL (ref 11.7–15.5)
Lymphocytes Relative: 32 %
Lymphs Abs: 1792 cells/uL (ref 850–3900)
MCH: 25.9 pg — AB (ref 27.0–33.0)
MCHC: 31.3 g/dL — AB (ref 32.0–36.0)
MCV: 82.9 fL (ref 80.0–100.0)
MPV: 10.2 fL (ref 7.5–12.5)
Monocytes Absolute: 448 cells/uL (ref 200–950)
Monocytes Relative: 8 %
NEUTROS ABS: 3304 {cells}/uL (ref 1500–7800)
NEUTROS PCT: 59 %
Platelets: 284 10*3/uL (ref 140–400)
RBC: 4.51 MIL/uL (ref 3.80–5.10)
RDW: 16.4 % — ABNORMAL HIGH (ref 11.0–15.0)
WBC: 5.6 10*3/uL (ref 3.8–10.8)

## 2015-10-07 NOTE — Progress Notes (Signed)
Complete Physical  Assessment and Plan: HTN-- continue medications, DASH diet, exercise and monitor at home. Call if greater than 130/80.   Allergic rhinitis- Allegra OTC, increase H20, allergy hygiene explained.  Vitamin D deficiency- continue supplement  Anemia- gets labs, continue iron, seeing Ob/GYN  Pulmonary hypertension- RVSP 45- asymptomatic  Hypokalemia- check BMP, off HCTZ  Hypomagnesemia- check mag, off HCTZ    Discussed med's effects and SE's. Screening labs and tests as requested with regular follow-up as recommended.  HPI 45 y.o. female  presents for a complete physical. She has had elevated blood pressure for 7-8 years. Her blood pressure has been controlled at home, today their BP is BP: 100/60 mmHg Her last labs showed hypokalemia/hyponatremia so she was taken off the HCTZ. Denies edema.  She does workout at her job, house keeping.  She denies chest pain, shortness of breath, dizziness.  She is not on cholesterol medication and denies myalgias. Her cholesterol is at goal. The cholesterol last visit was:   Lab Results  Component Value Date   CHOL 138 03/12/2014   HDL 44 03/12/2014   LDLCALC 80 03/12/2014   TRIG 72 03/12/2014   CHOLHDL 3.1 03/12/2014   Last A1C in the office was:  Lab Results  Component Value Date   HGBA1C 5.5 03/12/2014   She has had iron def anemia, normal exam Dr. Carlean Purl, had uterine fibroid embolization 04/2014 for menorrhagia that has improved.   Lab Results  Component Value Date   WBC 4.3 08/22/2015   HGB 10.6* 08/22/2015   HCT 35.1* 08/22/2015   MCV 79.6 08/22/2015   PLT 324 08/22/2015   Patient is on Vitamin D supplement.   Lab Results  Component Value Date   VD25OH 35 03/12/2014     She works as a Glass blower/designer, is in a same sex monogamous relationship x20+ years and has 2 children, 23 finished college at [employer] and 17.   Current Medications:  Current Outpatient Prescriptions on File Prior to Visit  Medication Sig Dispense  Refill  . benazepril (LOTENSIN) 20 MG tablet Take 1 tablet (20 mg total) by mouth daily. 90 tablet 1  . meloxicam (MOBIC) 15 MG tablet Take 1 tablet (15 mg total) by mouth daily. 90 tablet 1  . Cholecalciferol (VITAMIN D-3) 5000 UNITS TABS Take 1 tablet by mouth every evening. Reported on 10/07/2015     No current facility-administered medications on file prior to visit.   Health Maintenance:   Immunization History  Administered Date(s) Administered  . Pneumococcal-Unspecified 06/14/2001  . Td 06/14/2001  . Tdap 06/15/2013   Tetanus: 2015 Pneumovax: 2003 Flu vaccine: declines Zostavax: N/A Pap: 04/2014 see OB/GYN MGM:  will get at OB/GYN DEXA: N/A Colonoscopy: N/A EGD: N/A CXR 2015 Echo: 2010 PHTN 23, EF 65%  DEE- unknown name 2013 overdue Dentist- Dr. Arrie Aran q 6 months  Allergies: No Known Allergies Medical History:  Past Medical History  Diagnosis Date  . Hypertension   . Vitamin D deficiency   . Anemia   . Pulmonary hypertension ([place]) Via Echo 2010    EF 65% RVSP 45 (Dr. Stanford Breed)  . Hypokalemia   . Hypomagnesemia   . Fibroids    Surgical History:  Past Surgical History  Procedure Laterality Date  . Uterine artery embolization  04/27/2014   Family History:  Family History  Problem Relation Age of Onset  . Hypertension Mother   . Hyperlipidemia Mother   . Hyperlipidemia Father   . Hypertension Father    Social  History:  Social History  Substance Use Topics  . Smoking status: Never Smoker   . Smokeless tobacco: Never Used  . Alcohol Use: No   Review of Systems: see HPI  Physical Exam: Estimated body mass index is 23.26 kg/(m^2) as calculated from the following:   Height as of this encounter: 5\' 4"  (1.626 m).   Weight as of this encounter: 135 lb 9.6 oz (61.508 kg). BP 100/60 mmHg  Pulse 78  Temp(Src) 98.1 F (36.7 C) (Temporal)  Resp 16  Ht 5\' 4"  (1.626 m)  Wt 135 lb 9.6 oz (61.508 kg)  BMI 23.26 kg/m2  SpO2 99%  LMP 09/26/2015 General  Appearance: Well nourished, in no apparent distress. Eyes: PERRLA, EOMs, conjunctiva no swelling or erythema, normal fundi and vessels. Sinuses: No Frontal/maxillary tenderness ENT/Mouth: Ext aud canals clear, normal light reflex with TMs without erythema, bulging.  Good dentition. No erythema, swelling, or exudate on post pharynx. Tonsils not swollen or erythematous. Hearing normal.  Neck: Supple, thyroid normal. No bruits Respiratory: Respiratory effort normal, BS equal bilaterally without rales, rhonchi, wheezing or stridor. Cardio: RRR without murmurs, loud S2. Brisk peripheral pulses without edema.  Chest: symmetric, with normal excursions and percussion. Breasts: defer Abdomen:  +BS. Uterus firm, fixed to the level above the naval, with questionable tender right lower quadrant mass. no guarding, rebound, hernias.  Lymphatics: Non tender without lymphadenopathy.  Genitourinary: defer Musculoskeletal: Full ROM all peripheral extremities,5/5 strength, and normal gait. Skin: Warm, dry without rashes, lesions, ecchymosis.  Neuro: Cranial nerves intact, reflexes equal bilaterally. Normal muscle tone, no cerebellar symptoms. Sensation intact.  Psych: Awake and oriented X 3, normal affect, Insight and Judgment appropriate.   EKG: WNL no changes.  Vicie Mutters 2:13 PM

## 2015-10-07 NOTE — Patient Instructions (Signed)

## 2015-10-08 LAB — MICROALBUMIN / CREATININE URINE RATIO
Creatinine, Urine: 26 mg/dL (ref 20–320)
MICROALB/CREAT RATIO: 12 ug/mg{creat} (ref <30)
Microalb, Ur: 0.3 mg/dL

## 2015-10-08 LAB — HEMOGLOBIN A1C
HEMOGLOBIN A1C: 5.4 % (ref <5.7)
MEAN PLASMA GLUCOSE: 108 mg/dL

## 2015-10-08 LAB — URINALYSIS, ROUTINE W REFLEX MICROSCOPIC
Bilirubin Urine: NEGATIVE
GLUCOSE, UA: NEGATIVE
HGB URINE DIPSTICK: NEGATIVE
Ketones, ur: NEGATIVE
LEUKOCYTES UA: NEGATIVE
Nitrite: NEGATIVE
PH: 7 (ref 5.0–8.0)
Protein, ur: NEGATIVE
Specific Gravity, Urine: 1.004 (ref 1.001–1.035)

## 2015-10-08 LAB — HEPATIC FUNCTION PANEL
ALBUMIN: 4.1 g/dL (ref 3.6–5.1)
ALT: 11 U/L (ref 6–29)
AST: 14 U/L (ref 10–30)
Alkaline Phosphatase: 73 U/L (ref 33–115)
Bilirubin, Direct: 0.1 mg/dL (ref <=0.2)
Indirect Bilirubin: 0.5 mg/dL (ref 0.2–1.2)
TOTAL PROTEIN: 7.4 g/dL (ref 6.1–8.1)
Total Bilirubin: 0.6 mg/dL (ref 0.2–1.2)

## 2015-10-08 LAB — LIPID PANEL
CHOLESTEROL: 175 mg/dL (ref 125–200)
HDL: 75 mg/dL (ref >=46)
LDL Cholesterol: 87 mg/dL (ref <130)
Total CHOL/HDL Ratio: 2.3 Ratio (ref <=5.0)
Triglycerides: 66 mg/dL (ref <150)
VLDL: 13 mg/dL (ref <30)

## 2015-10-08 LAB — MAGNESIUM: MAGNESIUM: 1.9 mg/dL (ref 1.5–2.5)

## 2015-10-08 LAB — IRON AND TIBC
%SAT: 15 % (ref 11–50)
IRON: 70 ug/dL (ref 40–190)
TIBC: 473 ug/dL — AB (ref 250–450)
UIBC: 403 ug/dL — AB (ref 125–400)

## 2015-10-08 LAB — BASIC METABOLIC PANEL WITH GFR
BUN: 9 mg/dL (ref 7–25)
CALCIUM: 9.5 mg/dL (ref 8.6–10.2)
CHLORIDE: 101 mmol/L (ref 98–110)
CO2: 28 mmol/L (ref 20–31)
CREATININE: 0.63 mg/dL (ref 0.50–1.10)
GFR, Est African American: >89 mL/min (ref >=60)
GFR, Est Non African American: >89 mL/min (ref >=60)
Glucose, Bld: 84 mg/dL (ref 65–99)
Potassium: 3.9 mmol/L (ref 3.5–5.3)
SODIUM: 137 mmol/L (ref 135–146)

## 2015-10-08 LAB — FERRITIN: Ferritin: 9 ng/mL — ABNORMAL LOW (ref 10–232)

## 2015-10-08 LAB — VITAMIN D 25 HYDROXY (VIT D DEFICIENCY, FRACTURES): Vit D, 25-Hydroxy: 39 ng/mL (ref 30–100)

## 2015-10-08 LAB — INSULIN, FASTING: INSULIN FASTING, SERUM: 4.5 u[IU]/mL (ref 2.0–19.6)

## 2015-10-08 LAB — TSH: TSH: 1.58 m[IU]/L

## 2015-10-16 ENCOUNTER — Encounter: Payer: Self-pay | Admitting: Internal Medicine

## 2016-03-16 ENCOUNTER — Encounter: Payer: Self-pay | Admitting: Physician Assistant

## 2016-04-28 ENCOUNTER — Other Ambulatory Visit: Payer: Self-pay | Admitting: Internal Medicine

## 2016-04-28 DIAGNOSIS — I10 Essential (primary) hypertension: Secondary | ICD-10-CM

## 2016-06-03 ENCOUNTER — Encounter: Payer: Self-pay | Admitting: Interventional Radiology

## 2016-10-07 ENCOUNTER — Encounter: Payer: Self-pay | Admitting: Internal Medicine

## 2016-10-13 ENCOUNTER — Encounter: Payer: Self-pay | Admitting: Internal Medicine

## 2017-01-12 ENCOUNTER — Other Ambulatory Visit: Payer: Self-pay | Admitting: Internal Medicine

## 2017-01-12 DIAGNOSIS — I10 Essential (primary) hypertension: Secondary | ICD-10-CM

## 2017-02-19 ENCOUNTER — Other Ambulatory Visit: Payer: Self-pay

## 2017-02-19 DIAGNOSIS — I10 Essential (primary) hypertension: Secondary | ICD-10-CM

## 2017-02-19 MED ORDER — BENAZEPRIL HCL 20 MG PO TABS: ORAL_TABLET | ORAL | 0 refills | Status: DC

## 2017-02-20 ENCOUNTER — Other Ambulatory Visit: Payer: Self-pay | Admitting: Internal Medicine

## 2017-02-20 DIAGNOSIS — I10 Essential (primary) hypertension: Secondary | ICD-10-CM

## 2017-02-22 ENCOUNTER — Ambulatory Visit: Payer: Self-pay | Admitting: Adult Health

## 2017-03-07 NOTE — Progress Notes (Signed)
FOLLOW UP  Assessment and Plan:    Hypertension  Continue medication: benazepril 20 mg daily, starting HCTZ today- start at 12.5 mg daily, check BPs- if continue to run higher than 130/90, go up to 25 mg. Will bring back for BP check and follow up labs in 4 weeks.   Monitor blood pressure at home.  Continue DASH diet.    Reminder to go to the ER if any CP, SOB, nausea, dizziness, severe HA, changes vision/speech, left arm numbness and tingling and jaw pain.  Pulmonary hypertension  RVSP 45 (2010), CXR 2015 unremarkable- patient is asymptomatic, continue to monitor  Vitamin D Def/ osteoporosis prevention  Consider increasing supplementation pending labs, currently: cholecalciferol 5000 U daily  Check Vit D level  Anemia  Continue iron supp with Vitamin C and increase green leafy veggies  CBC  Follow up in 4 weeks for labs only BMP with GFR, and recheck BP  Continue diet and meds as discussed. Further disposition pending results of labs. Discussed med's effects and SE's.  Over 30 minutes of exam, counseling, chart review, and critical decision making was performed.   No future appointments.  ----------------------------------------------------------------------------------------------------------------------  HPI 46 y.o. female  presents for 3 month follow up on hypertension, pulmonary hypertension, iron deficiency anemia and vitamin D deficiency. 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 She does endorse increased stress related to this and not getting as much sleep during the day.   Patient also has documented history of pulmonary hypertension apparently noted on an ECHO in 2010- patient is unaware of this diagnosis, but denies exertional dyspnea, lethargy, fatigue, snoring, AM headaches. Patient is self-pay and does not wish to pursue follow up on this, will continue to monitor for symptoms.   Her blood pressure has been controlled at  130/80 when checked intermittently, today their BP is 160/100. Manual recheck after rest: 166/102. She does not workout, but is at a very physical job. She denies chest pain, shortness of breath, dizziness.  Patient is on Vitamin D supplement but takes infrequently and remains below goal:   Lab Results  Component Value Date   VD25OH 39 10/07/2015      CBC Latest Ref Rng & Units 10/07/2015 08/22/2015 04/27/2014  WBC 3.8 - 10.8 K/uL 5.6 4.3 5.0  Hemoglobin 11.7 - 15.5 g/dL 11.7 10.6(L) 11.4(L)  Hematocrit 35.0 - 45.0 % 37.4 35.1(L) 34.6(L)  Platelets 140 - 400 K/uL 284 324 281   Iron/TIBC/Ferritin/ %Sat    Component Value Date/Time   IRON 70 10/07/2015 1436   TIBC 473 (H) 10/07/2015 1436   FERRITIN 9 (L) 10/07/2015 1436   IRONPCTSAT 15 10/07/2015 1436       Current Medications:  Current Outpatient Prescriptions on File Prior to Visit  Medication Sig  . benazepril (LOTENSIN) 20 MG tablet TAKE 1 TABLET BY MOUTH FOR BLOOD PRESSURE- NEED OFFICE VISIT BEFORE REFILL  . Cholecalciferol (VITAMIN D-3) 5000 UNITS TABS Take 1 tablet by mouth every evening. Reported on 10/07/2015  . meloxicam (MOBIC) 15 MG tablet Take 1 tablet (15 mg total) by mouth daily.   No current facility-administered medications on file prior to visit.      Allergies: No Known Allergies   Medical History:  Past Medical History:  Diagnosis Date  . Anemia   . Fibroids   . Hypertension   . Hypokalemia   . Hypomagnesemia   . Pulmonary hypertension Via Echo 2010   EF 65% RVSP 45 (  Dr. Stanford Breed)  . Vitamin D deficiency    Family history- Reviewed and unchanged Social history- Reviewed and unchanged   Review of Systems:  Review of Systems  Constitutional: Negative.  Negative for chills, fever, malaise/fatigue and weight loss.  HENT: Negative for congestion, hearing loss and sore throat.   Eyes: Negative for blurred vision.  Respiratory: Negative for cough, shortness of breath and wheezing.   Cardiovascular:  Negative for chest pain, palpitations, orthopnea, claudication and leg swelling.  Gastrointestinal: Negative for abdominal pain, blood in stool, constipation, diarrhea, heartburn, melena, nausea and vomiting.  Genitourinary: Negative.   Musculoskeletal: Negative for myalgias.  Skin: Negative.   Neurological: Negative for dizziness, tingling, sensory change and headaches.  Endo/Heme/Allergies: Negative.   Psychiatric/Behavioral: Negative.     Physical Exam: There were no vitals taken for this visit. Wt Readings from Last 3 Encounters:  10/07/15 135 lb 9.6 oz (61.5 kg)  08/22/15 136 lb (61.7 kg)  04/28/14 139 lb 11.2 oz (63.4 kg)   General Appearance: Well nourished, in no apparent distress. Eyes: PERRLA, EOMs, conjunctiva no swelling or erythema Sinuses: No Frontal/maxillary tenderness ENT/Mouth: Ext aud canals clear, TMs without erythema, bulging. No erythema, swelling, or exudate on post pharynx.  Tonsils not swollen or erythematous. Hearing normal.  Neck: Supple, thyroid normal.  Respiratory: Respiratory effort normal, BS equal bilaterally without rales, rhonchi, wheezing or stridor.  Cardio: RRR, bounding, but with no MRGs. Brisk peripheral pulses without edema.  Abdomen: Soft, + BS.  Non tender, no guarding, rebound, hernias, masses. Lymphatics: Non tender without lymphadenopathy.  Musculoskeletal: Full ROM, 5/5 strength, Normal gait Skin: Warm, dry without rashes, lesions, ecchymosis.  Neuro: Cranial nerves intact. No cerebellar symptoms.  Psych: Awake and oriented X 3, normal affect, Insight and Judgment appropriate.    Izora Ribas, NP 4:43 PM [facility]

## 2017-03-08 ENCOUNTER — Ambulatory Visit (INDEPENDENT_AMBULATORY_CARE_PROVIDER_SITE_OTHER): Payer: Self-pay | Admitting: Adult Health

## 2017-03-08 ENCOUNTER — Encounter: Payer: Self-pay | Admitting: Adult Health

## 2017-03-08 VITALS — BP 166/104

## 2017-03-08 DIAGNOSIS — E559 Vitamin D deficiency, unspecified: Secondary | ICD-10-CM

## 2017-03-08 DIAGNOSIS — I272 Pulmonary hypertension, unspecified: Secondary | ICD-10-CM

## 2017-03-08 DIAGNOSIS — D509 Iron deficiency anemia, unspecified: Secondary | ICD-10-CM

## 2017-03-08 DIAGNOSIS — I10 Essential (primary) hypertension: Secondary | ICD-10-CM

## 2017-03-08 DIAGNOSIS — Z79899 Other long term (current) drug therapy: Secondary | ICD-10-CM

## 2017-03-08 MED ORDER — HYDROCHLOROTHIAZIDE 25 MG PO TABS
25.0000 mg | ORAL_TABLET | Freq: Every day | ORAL | 1 refills | Status: DC
Start: 2017-03-08 — End: 2017-04-05

## 2017-03-08 MED ORDER — HYDROCHLOROTHIAZIDE 25 MG PO TABS: 25.0000 mg | ORAL_TABLET | Freq: Every day | ORAL | 1 refills | Status: DC

## 2017-03-08 NOTE — Patient Instructions (Signed)
Start by taking 1/2 tab (12.5 mg) for 2 weeks, check BP regularly, if still higher than 130/90, go up to full tab.    Hydrochlorothiazide, HCTZ capsules or tablets What is this medicine? HYDROCHLOROTHIAZIDE (hye droe klor oh THYE a zide) is a diuretic. It increases the amount of urine passed, which causes the body to lose salt and water. This medicine is used to treat high blood pressure. It is also reduces the swelling and water retention caused by various medical conditions, such as heart, liver, or kidney disease. This medicine may be used for other purposes; ask your health care provider or pharmacist if you have questions. COMMON BRAND NAME(S): Esidrix, Ezide, HydroDIURIL, Microzide, Oretic, Zide What should I tell my health care provider before I take this medicine? They need to know if you have any of these conditions: -diabetes -gout -immune system problems, like lupus -kidney disease or kidney stones -liver disease -pancreatitis -small amount of urine or difficulty passing urine -an unusual or allergic reaction to hydrochlorothiazide, sulfa drugs, other medicines, foods, dyes, or preservatives -pregnant or trying to get pregnant -breast-feeding How should I use this medicine? Take this medicine by mouth with a glass of water. Follow the directions on the prescription label. Take your medicine at regular intervals. Remember that you will need to pass urine frequently after taking this medicine. Do not take your doses at a time of day that will cause you problems. Do not stop taking your medicine unless your doctor tells you to. Talk to your pediatrician regarding the use of this medicine in children. Special care may be needed. Overdosage: If you think you have taken too much of this medicine contact a poison control center or emergency room at once. NOTE: This medicine is only for you. Do not share this medicine with others. What if I miss a dose? If you miss a dose, take it as  soon as you can. If it is almost time for your next dose, take only that dose. Do not take double or extra doses. What may interact with this medicine? -cholestyramine -colestipol -digoxin -dofetilide -lithium -medicines for blood pressure -medicines for diabetes -medicines that relax muscles for surgery -other diuretics -steroid medicines like prednisone or cortisone This list may not describe all possible interactions. Give your health care provider a list of all the medicines, herbs, non-prescription drugs, or dietary supplements you use. Also tell them if you smoke, drink alcohol, or use illegal drugs. Some items may interact with your medicine. What should I watch for while using this medicine? Visit your doctor or health care professional for regular checks on your progress. Check your blood pressure as directed. Ask your doctor or health care professional what your blood pressure should be and when you should contact him or her. You may need to be on a special diet while taking this medicine. Ask your doctor. Check with your doctor or health care professional if you get an attack of severe diarrhea, nausea and vomiting, or if you sweat a lot. The loss of too much body fluid can make it dangerous for you to take this medicine. You may get drowsy or dizzy. Do not drive, use machinery, or do anything that needs mental alertness until you know how this medicine affects you. Do not stand or sit up quickly, especially if you are an older patient. This reduces the risk of dizzy or fainting spells. Alcohol may interfere with the effect of this medicine. Avoid alcoholic drinks. This medicine may affect  your blood sugar level. If you have diabetes, check with your doctor or health care professional before changing the dose of your diabetic medicine. This medicine can make you more sensitive to the sun. Keep out of the sun. If you cannot avoid being in the sun, wear protective clothing and use  sunscreen. Do not use sun lamps or tanning beds/booths. What side effects may I notice from receiving this medicine? Side effects that you should report to your doctor or health care professional as soon as possible: -allergic reactions such as skin rash or itching, hives, swelling of the lips, mouth, tongue, or throat -changes in vision -chest pain -eye pain -fast or irregular heartbeat -feeling faint or lightheaded, falls -gout attack -muscle pain or cramps -pain or difficulty when passing urine -pain, tingling, numbness in the hands or feet -redness, blistering, peeling or loosening of the skin, including inside the mouth -unusually weak or tired Side effects that usually do not require medical attention (report to your doctor or health care professional if they continue or are bothersome): -change in sex drive or performance -dry mouth -headache -stomach upset This list may not describe all possible side effects. Call your doctor for medical advice about side effects. You may report side effects to FDA at 1-800-FDA-1088. Where should I keep my medicine? Keep out of the reach of children. Store at room temperature between 15 and 30 degrees C (59 and 86 degrees F). Do not freeze. Protect from light and moisture. Keep container closed tightly. Throw away any unused medicine after the expiration date. NOTE: This sheet is a summary. It may not cover all possible information. If you have questions about this medicine, talk to your doctor, pharmacist, or health care provider.  2018 Elsevier/Gold Standard (2010-01-10 12:57:37)

## 2017-03-09 LAB — HEPATIC FUNCTION PANEL
AG Ratio: 1.5 (calc) (ref 1.0–2.5)
ALBUMIN MSPROF: 4.3 g/dL (ref 3.6–5.1)
ALT: 11 U/L (ref 6–29)
AST: 15 U/L (ref 10–35)
Alkaline phosphatase (APISO): 87 U/L (ref 33–115)
BILIRUBIN DIRECT: 0.2 mg/dL (ref 0.0–0.2)
BILIRUBIN TOTAL: 0.8 mg/dL (ref 0.2–1.2)
GLOBULIN: 2.8 g/dL (ref 1.9–3.7)
Indirect Bilirubin: 0.6 mg/dL (calc) (ref 0.2–1.2)
Total Protein: 7.1 g/dL (ref 6.1–8.1)

## 2017-03-09 LAB — CBC WITH DIFFERENTIAL/PLATELET
BASOS PCT: 0.2 %
Basophils Absolute: 11 cells/uL (ref 0–200)
EOS ABS: 99 {cells}/uL (ref 15–500)
EOS PCT: 1.8 %
HCT: 36.7 % (ref 35.0–45.0)
HEMOGLOBIN: 12.4 g/dL (ref 11.7–15.5)
Lymphs Abs: 1524 cells/uL (ref 850–3900)
MCH: 30.5 pg (ref 27.0–33.0)
MCHC: 33.8 g/dL (ref 32.0–36.0)
MCV: 90.2 fL (ref 80.0–100.0)
MONOS PCT: 9 %
MPV: 11.1 fL (ref 7.5–12.5)
NEUTROS ABS: 3372 {cells}/uL (ref 1500–7800)
Neutrophils Relative %: 61.3 %
PLATELETS: 250 10*3/uL (ref 140–400)
RBC: 4.07 10*6/uL (ref 3.80–5.10)
RDW: 11.8 % (ref 11.0–15.0)
TOTAL LYMPHOCYTE: 27.7 %
WBC mixed population: 495 cells/uL (ref 200–950)
WBC: 5.5 10*3/uL (ref 3.8–10.8)

## 2017-03-09 LAB — VITAMIN D 25 HYDROXY (VIT D DEFICIENCY, FRACTURES): Vit D, 25-Hydroxy: 31 ng/mL (ref 30–100)

## 2017-03-09 LAB — BASIC METABOLIC PANEL WITH GFR
BUN: 8 mg/dL (ref 7–25)
CO2: 27 mmol/L (ref 20–32)
CREATININE: 0.8 mg/dL (ref 0.50–1.10)
Calcium: 9.2 mg/dL (ref 8.6–10.2)
Chloride: 103 mmol/L (ref 98–110)
GFR, EST NON AFRICAN AMERICAN: 88 mL/min/{1.73_m2} (ref >=60)
GFR, Est African American: 102 mL/min/{1.73_m2} (ref >=60)
Glucose, Bld: 91 mg/dL (ref 65–99)
Potassium: 3.7 mmol/L (ref 3.5–5.3)
SODIUM: 139 mmol/L (ref 135–146)

## 2017-03-09 LAB — MAGNESIUM: Magnesium: 1.9 mg/dL (ref 1.5–2.5)

## 2017-03-09 LAB — TSH: TSH: 3.75 m[IU]/L

## 2017-03-10 NOTE — Progress Notes (Signed)
Pt aware of lab results & voiced understanding of those results.

## 2017-03-29 ENCOUNTER — Other Ambulatory Visit: Payer: Self-pay | Admitting: Internal Medicine

## 2017-03-29 DIAGNOSIS — I10 Essential (primary) hypertension: Secondary | ICD-10-CM

## 2017-04-05 ENCOUNTER — Ambulatory Visit: Payer: Self-pay

## 2017-04-05 ENCOUNTER — Other Ambulatory Visit: Payer: Self-pay

## 2017-04-05 DIAGNOSIS — I10 Essential (primary) hypertension: Secondary | ICD-10-CM

## 2017-04-05 MED ORDER — HYDROCHLOROTHIAZIDE 25 MG PO TABS: 25.0000 mg | ORAL_TABLET | Freq: Every day | ORAL | 1 refills | Status: DC

## 2017-04-06 ENCOUNTER — Ambulatory Visit: Payer: Self-pay | Admitting: Adult Health

## 2017-04-06 ENCOUNTER — Other Ambulatory Visit: Payer: Self-pay | Admitting: Adult Health

## 2017-04-06 DIAGNOSIS — I10 Essential (primary) hypertension: Secondary | ICD-10-CM

## 2017-04-06 MED ORDER — HYDROCHLOROTHIAZIDE 25 MG PO TABS: 25.0000 mg | ORAL_TABLET | Freq: Every day | ORAL | 1 refills | Status: DC

## 2017-04-13 ENCOUNTER — Other Ambulatory Visit: Payer: Self-pay | Admitting: Internal Medicine

## 2017-04-13 DIAGNOSIS — I10 Essential (primary) hypertension: Secondary | ICD-10-CM

## 2017-05-12 ENCOUNTER — Ambulatory Visit (INDEPENDENT_AMBULATORY_CARE_PROVIDER_SITE_OTHER): Payer: Self-pay | Admitting: *Deleted

## 2017-05-12 DIAGNOSIS — Z79899 Other long term (current) drug therapy: Secondary | ICD-10-CM

## 2017-05-12 DIAGNOSIS — I10 Essential (primary) hypertension: Secondary | ICD-10-CM

## 2017-05-12 LAB — BASIC METABOLIC PANEL WITH GFR
BUN: 13 mg/dL (ref 7–25)
CALCIUM: 9.6 mg/dL (ref 8.6–10.2)
CHLORIDE: 100 mmol/L (ref 98–110)
CO2: 27 mmol/L (ref 20–32)
CREATININE: 0.78 mg/dL (ref 0.50–1.10)
GFR, Est African American: 106 mL/min/{1.73_m2} (ref >=60)
GFR, Est Non African American: 91 mL/min/{1.73_m2} (ref >=60)
GLUCOSE: 92 mg/dL (ref 65–99)
Potassium: 3.3 mmol/L — ABNORMAL LOW (ref 3.5–5.3)
Sodium: 136 mmol/L (ref 135–146)

## 2017-05-12 NOTE — Progress Notes (Signed)
Patient here for a BMET and BP recheck.  Patient's BP is 150/82 and pulse is 92.  She is taking Benazepril 20 mg and added HCTZ 25 mg daily.  Liane Comber is aware of readings and patient was advised to increase her water intake and reduce her sodium intake. She will schedule a 3 month follow up with Liane Comber, NP, at check out.

## 2017-05-13 ENCOUNTER — Other Ambulatory Visit: Payer: Self-pay | Admitting: Adult Health

## 2017-05-13 DIAGNOSIS — Z79899 Other long term (current) drug therapy: Secondary | ICD-10-CM

## 2017-05-13 DIAGNOSIS — I10 Essential (primary) hypertension: Secondary | ICD-10-CM

## 2017-05-13 DIAGNOSIS — E876 Hypokalemia: Secondary | ICD-10-CM

## 2017-05-13 MED ORDER — POTASSIUM CHLORIDE ER 10 MEQ PO TBCR: 10.0000 meq | EXTENDED_RELEASE_TABLET | Freq: Every day | ORAL | 1 refills | Status: DC

## 2017-05-13 MED ORDER — HYDROCHLOROTHIAZIDE 25 MG PO TABS: 12.5000 mg | ORAL_TABLET | Freq: Every day | ORAL | 1 refills | Status: DC

## 2017-05-13 NOTE — Progress Notes (Signed)
Potassium low - 3.3 - after initiating HCTZ 25 mg - will reduce to 12.5 mg and add low dose potassium supplement for 30 days. OV scheduled for follow up; may need to discuss adding 3rd agent - consider CCB.

## 2017-08-17 ENCOUNTER — Other Ambulatory Visit: Payer: Self-pay | Admitting: Physician Assistant

## 2017-08-17 DIAGNOSIS — I10 Essential (primary) hypertension: Secondary | ICD-10-CM

## 2017-08-17 NOTE — Progress Notes (Deleted)
FOLLOW UP  Assessment and Plan:   Hypertension Well controlled with current medications  Monitor blood pressure at home; patient to call if consistently greater than 130/80 Continue DASH diet.   Reminder to go to the ER if any CP, SOB, nausea, dizziness, severe HA, changes vision/speech, left arm numbness and tingling and jaw pain.  Pulmonary hypertension RVSP 45 (2010), CXR 2015 unremarkable- patient is asymptomatic, continue to monitor  Vitamin D Def Continue supplementation to maintain goal of 70-100 Patient is self pay; defer Vit D level to CPE  Continue diet and meds as discussed. Further disposition pending results of labs. Discussed med's effects and SE's.   Over 30 minutes of exam, counseling, chart review, and critical decision making was performed.   Future Appointments  Date Time Provider [place]  08/19/2017  2:00 PM Liane Comber, NP [facility] None    ----------------------------------------------------------------------------------------------------------------------  HPI 47 y.o. female  presents for 3 month follow up on hypertension, weight management and vitamin D deficiency.  She has hx of pulmonary hypertension RVSP 45 (2010), CXR 2015 unremarkable- patient is asymptomatic, continue to monitor.  Due for CPE at next visit -    BMI is There is no height or weight on file to calculate BMI., she {HAS HAS SWN:46270} been working on diet and exercise. Wt Readings from Last 3 Encounters:  10/07/15 135 lb 9.6 oz (61.5 kg)  08/22/15 136 lb (61.7 kg)  04/28/14 139 lb 11.2 oz (63.4 kg)   Her blood pressure {HAS HAS NOT:18834} been controlled at home, today their BP is    She is currently taking ***  She {DOES_DOES JJK:09381} workout. She denies chest pain, shortness of breath, dizziness.  Patient is on Vitamin D supplement:   Lab Results  Component Value Date   VD25OH 31 03/08/2017        Current Medications:  Current Outpatient Medications on  File Prior to Visit  Medication Sig  . benazepril (LOTENSIN) 20 MG tablet TAKE 1 TABLET BY MOUTH ONCE DAILY FOR BLOOD PRESSURE  . Cholecalciferol (VITAMIN D-3) 5000 UNITS TABS Take 1 tablet by mouth every evening. Reported on 10/07/2015  . hydrochlorothiazide (HYDRODIURIL) 25 MG tablet Take 0.5 tablets (12.5 mg total) by mouth daily. Take 1 tab daily  . meloxicam (MOBIC) 15 MG tablet Take 1 tablet (15 mg total) by mouth daily.  . potassium chloride (K-DUR) 10 MEQ tablet Take 1 tablet (10 mEq total) by mouth daily.   No current facility-administered medications on file prior to visit.     Allergies: No Known Allergies   Medical History:  Past Medical History:  Diagnosis Date  . Anemia   . Fibroids   . Hypertension   . Hypokalemia   . Hypomagnesemia   . Pulmonary hypertension ([place]) Via Echo 2010   EF 65% RVSP 45 (Dr. Stanford Breed)  . Vitamin D deficiency    Family history- Reviewed and unchanged Social history- Reviewed and unchanged   Review of Systems:  Review of Systems  Constitutional: Negative for malaise/fatigue and weight loss.  HENT: Negative for hearing loss and tinnitus.   Eyes: Negative for blurred vision and double vision.  Respiratory: Negative for cough, shortness of breath and wheezing.   Cardiovascular: Negative for chest pain, palpitations, orthopnea, claudication and leg swelling.  Gastrointestinal: Negative for abdominal pain, blood in stool, constipation, diarrhea, heartburn, melena, nausea and vomiting.  Genitourinary: Negative.   Musculoskeletal: Negative for joint pain and myalgias.  Skin: Negative for rash.  Neurological: Negative for dizziness, tingling,  sensory change, weakness and headaches.  Endo/Heme/Allergies: Negative for polydipsia.  Psychiatric/Behavioral: Negative.   All other systems reviewed and are negative.     Physical Exam: There were no vitals taken for this visit. Wt Readings from Last 3 Encounters:  10/07/15 135 lb 9.6 oz (61.5 kg)   08/22/15 136 lb (61.7 kg)  04/28/14 139 lb 11.2 oz (63.4 kg)   General Appearance: Well nourished, in no apparent distress. Eyes: PERRLA, EOMs, conjunctiva no swelling or erythema Sinuses: No Frontal/maxillary tenderness ENT/Mouth: Ext aud canals clear, TMs without erythema, bulging. No erythema, swelling, or exudate on post pharynx.  Tonsils not swollen or erythematous. Hearing normal.  Neck: Supple, thyroid normal.  Respiratory: Respiratory effort normal, BS equal bilaterally without rales, rhonchi, wheezing or stridor.  Cardio: RRR with no MRGs. Brisk peripheral pulses without edema.  Abdomen: Soft, + BS.  Non tender, no guarding, rebound, hernias, masses. Lymphatics: Non tender without lymphadenopathy.  Musculoskeletal: Full ROM, 5/5 strength, {PSY - GAIT AND STATION:22860} gait Skin: Warm, dry without rashes, lesions, ecchymosis.  Neuro: Cranial nerves intact. No cerebellar symptoms.  Psych: Awake and oriented X 3, normal affect, Insight and Judgment appropriate.    Izora Ribas, NP 2:07 PM [facility]

## 2017-08-19 ENCOUNTER — Ambulatory Visit: Payer: Self-pay | Admitting: Adult Health

## 2017-09-08 ENCOUNTER — Ambulatory Visit: Payer: Self-pay | Admitting: Adult Health

## 2017-09-27 NOTE — Progress Notes (Deleted)
FOLLOW UP  Assessment and Plan:   Pulmonary hypertension RVSP 45 (2010), CXR 2015 unremarkable  patient is asymptomatic, declines further workup, continue to monitor  Hypertension Well controlled with current medications  Monitor blood pressure at home; patient to call if consistently greater than 130/80 Continue DASH diet.   Reminder to go to the ER if any CP, SOB, nausea, dizziness, severe HA, changes vision/speech, left arm numbness and tingling and jaw pain.  Vitamin D Def At goal at last visit; continue supplementation  Defer Vit D level  Continue diet and meds as discussed. Further disposition pending results of labs. Discussed med's effects and SE's.   Over 30 minutes of exam, counseling, chart review, and critical decision making was performed.   Future Appointments  Date Time Provider Terr[place]  09/28/2017  9:30 AM Liane Comber, NP [facility] None    ----------------------------------------------------------------------------------------------------------------------  HPI 47 y.o. female  presents for 3 month follow up on hypertension and vitamin D deficiency. She also has Patient also has documented history of pulmonary hypertension apparently noted on an ECHO in 2010- patient is unaware of this diagnosis, but denies exertional dyspnea, lethargy, fatigue, snoring, AM headaches. Patient is self-pay and does not wish to pursue follow up on this, will continue to monitor for symptoms.   BMI is There is no height or weight on file to calculate BMI., she {HAS HAS FAO:13086} been working on diet and exercise. Wt Readings from Last 3 Encounters:  10/07/15 135 lb 9.6 oz (61.5 kg)  08/22/15 136 lb (61.7 kg)  04/28/14 139 lb 11.2 oz (63.4 kg)   Her blood pressure {HAS HAS NOT:18834} been controlled at home, today their BP is    She {DOES_DOES VHQ:46962} workout. She denies chest pain, shortness of breath, dizziness.  Patient is on Vitamin D supplement.   Lab  Results  Component Value Date   VD25OH 31 03/08/2017        Current Medications:  Current Outpatient Medications on File Prior to Visit  Medication Sig  . benazepril (LOTENSIN) 20 MG tablet TAKE 1 TABLET BY MOUTH ONCE DAILY FOR BLOOD PRESSURE  . Cholecalciferol (VITAMIN D-3) 5000 UNITS TABS Take 1 tablet by mouth every evening. Reported on 10/07/2015  . hydrochlorothiazide (HYDRODIURIL) 25 MG tablet Take 0.5 tablets (12.5 mg total) by mouth daily. Take 1 tab daily  . meloxicam (MOBIC) 15 MG tablet Take 1 tablet (15 mg total) by mouth daily.  . potassium chloride (K-DUR) 10 MEQ tablet Take 1 tablet (10 mEq total) by mouth daily.   No current facility-administered medications on file prior to visit.      Allergies: No Known Allergies   Medical History:  Past Medical History:  Diagnosis Date  . Anemia   . Fibroids   . Hypertension   . Hypokalemia   . Hypomagnesemia   . Pulmonary hypertension ([place]) Via Echo 2010   EF 65% RVSP 45 (Dr. Stanford Breed)  . Vitamin D deficiency    Family history- Reviewed and unchanged Social history- Reviewed and unchanged   Review of Systems:  Review of Systems  Constitutional: Negative for malaise/fatigue and weight loss.  HENT: Negative for hearing loss and tinnitus.   Eyes: Negative for blurred vision and double vision.  Respiratory: Negative for cough, shortness of breath and wheezing.   Cardiovascular: Negative for chest pain, palpitations, orthopnea, claudication and leg swelling.  Gastrointestinal: Negative for abdominal pain, blood in stool, constipation, diarrhea, heartburn, melena, nausea and vomiting.  Genitourinary: Negative.   Musculoskeletal: Negative  for joint pain and myalgias.  Skin: Negative for rash.  Neurological: Negative for dizziness, tingling, sensory change, weakness and headaches.  Endo/Heme/Allergies: Negative for polydipsia.  Psychiatric/Behavioral: Negative.   All other systems reviewed and are  negative.     Physical Exam: There were no vitals taken for this visit. Wt Readings from Last 3 Encounters:  10/07/15 135 lb 9.6 oz (61.5 kg)  08/22/15 136 lb (61.7 kg)  04/28/14 139 lb 11.2 oz (63.4 kg)   General Appearance: Well nourished, in no apparent distress. Eyes: PERRLA, EOMs, conjunctiva no swelling or erythema Sinuses: No Frontal/maxillary tenderness ENT/Mouth: Ext aud canals clear, TMs without erythema, bulging. No erythema, swelling, or exudate on post pharynx.  Tonsils not swollen or erythematous. Hearing normal.  Neck: Supple, thyroid normal.  Respiratory: Respiratory effort normal, BS equal bilaterally without rales, rhonchi, wheezing or stridor.  Cardio: RRR with no MRGs. Brisk peripheral pulses without edema.  Abdomen: Soft, + BS.  Non tender, no guarding, rebound, hernias, masses. Lymphatics: Non tender without lymphadenopathy.  Musculoskeletal: Full ROM, 5/5 strength, {PSY - GAIT AND STATION:22860} gait Skin: Warm, dry without rashes, lesions, ecchymosis.  Neuro: Cranial nerves intact. No cerebellar symptoms.  Psych: Awake and oriented X 3, normal affect, Insight and Judgment appropriate.    Izora Ribas, NP 8:25 AM [facility]

## 2017-09-28 ENCOUNTER — Ambulatory Visit: Payer: Self-pay | Admitting: Adult Health

## 2017-12-10 ENCOUNTER — Other Ambulatory Visit: Payer: Self-pay | Admitting: Adult Health

## 2017-12-10 DIAGNOSIS — I10 Essential (primary) hypertension: Secondary | ICD-10-CM

## 2017-12-13 ENCOUNTER — Other Ambulatory Visit: Payer: Self-pay | Admitting: Adult Health

## 2017-12-13 DIAGNOSIS — I10 Essential (primary) hypertension: Secondary | ICD-10-CM

## 2018-01-13 ENCOUNTER — Telehealth: Payer: Self-pay

## 2018-01-13 ENCOUNTER — Other Ambulatory Visit: Payer: Self-pay | Admitting: Adult Health

## 2018-01-13 DIAGNOSIS — I10 Essential (primary) hypertension: Secondary | ICD-10-CM

## 2018-01-13 MED ORDER — BENAZEPRIL HCL 20 MG PO TABS: ORAL_TABLET | ORAL | 0 refills | Status: DC

## 2018-01-13 NOTE — Telephone Encounter (Signed)
Patient has an upcoming appointment for next week but is out of Benazepril and is requesting enough to last until seen. Please advise

## 2018-01-14 NOTE — Telephone Encounter (Signed)
Left detailed message on VM.

## 2018-01-17 NOTE — Progress Notes (Deleted)
FOLLOW UP  Assessment and Plan:   Pulmonary hypertension RVSP 45 (2010), CXR 2015 unremarkable- patient is asymptomatic, continue to monitor  Hypertension Well controlled with current medications  Monitor blood pressure at home; patient to call if consistently greater than 130/80 Continue DASH diet.   Reminder to go to the ER if any CP, SOB, nausea, dizziness, severe HA, changes vision/speech, left arm numbness and tingling and jaw pain.  BMI 23 Long discussion about weight loss, diet, and exercise Recommended diet heavy in fruits and veggies and low in animal meats, cheeses, and dairy products, appropriate calorie intake Discussed ideal weight for height and initial weight goal (***) Patient will work on *** Will follow up in 3 months  Vitamin D Def At goal at last visit; continue supplementation to maintain goal of 70-100 Defer Vit D level  Continue diet and meds as discussed. Further disposition pending results of labs. Discussed med's effects and SE's.   Over 30 minutes of exam, counseling, chart review, and critical decision making was performed.   Future Appointments  Date Time Provider Vincent  01/18/2018  3:30 PM Liane Comber, NP [facility] None    ----------------------------------------------------------------------------------------------------------------------  HPI 47 y.o. female  presents for 6+ month follow up on hypertension, weight and vitamin D deficiency. Patient was noted to have pulmonary htn on ECHO 2010 (RVSP 45), CXR 2015 unremarkable- patient is asymptomatic.   Schedule CPE ***  BMI is There is no height or weight on file to calculate BMI., she {HAS HAS GGE:36629} been working on diet and exercise. Wt Readings from Last 3 Encounters:  10/07/15 135 lb 9.6 oz (61.5 kg)  08/22/15 136 lb (61.7 kg)  04/28/14 139 lb 11.2 oz (63.4 kg)   Her blood pressure {HAS HAS NOT:18834} been controlled at home, today their BP is    She {DOES_DOES  UTM:54650} workout. She denies chest pain, shortness of breath, dizziness.  Patient is on Vitamin D supplement.   Lab Results  Component Value Date   VD25OH 31 03/08/2017        Current Medications:  Current Outpatient Medications on File Prior to Visit  Medication Sig  . benazepril (LOTENSIN) 20 MG tablet Take 1 tab by mouth daily for BP.  Marland Kitchen Cholecalciferol (VITAMIN D-3) 5000 UNITS TABS Take 1 tablet by mouth every evening. Reported on 10/07/2015  . hydrochlorothiazide (HYDRODIURIL) 25 MG tablet Take 0.5 tablets (12.5 mg total) by mouth daily. Take 1 tab daily  . meloxicam (MOBIC) 15 MG tablet Take 1 tablet (15 mg total) by mouth daily.  . potassium chloride (K-DUR) 10 MEQ tablet Take 1 tablet (10 mEq total) by mouth daily.   No current facility-administered medications on file prior to visit.      Allergies: No Known Allergies   Medical History:  Past Medical History:  Diagnosis Date  . Anemia   . Fibroids   . Hypertension   . Hypokalemia   . Hypomagnesemia   . Pulmonary hypertension ([place]) Via Echo 2010   EF 65% RVSP 45 (Dr. Stanford Breed)  . Vitamin D deficiency    Family history- Reviewed and unchanged Social history- Reviewed and unchanged   Review of Systems:  Review of Systems  Constitutional: Negative for malaise/fatigue and weight loss.  HENT: Negative for hearing loss and tinnitus.   Eyes: Negative for blurred vision and double vision.  Respiratory: Negative for cough, shortness of breath and wheezing.   Cardiovascular: Negative for chest pain, palpitations, orthopnea, claudication and leg swelling.  Gastrointestinal: Negative  for abdominal pain, blood in stool, constipation, diarrhea, heartburn, melena, nausea and vomiting.  Genitourinary: Negative.   Musculoskeletal: Negative for joint pain and myalgias.  Skin: Negative for rash.  Neurological: Negative for dizziness, tingling, sensory change, weakness and headaches.  Endo/Heme/Allergies: Negative for  polydipsia.  Psychiatric/Behavioral: Negative.   All other systems reviewed and are negative.     Physical Exam: There were no vitals taken for this visit. Wt Readings from Last 3 Encounters:  10/07/15 135 lb 9.6 oz (61.5 kg)  08/22/15 136 lb (61.7 kg)  04/28/14 139 lb 11.2 oz (63.4 kg)   General Appearance: Well nourished, in no apparent distress. Eyes: PERRLA, EOMs, conjunctiva no swelling or erythema Sinuses: No Frontal/maxillary tenderness ENT/Mouth: Ext aud canals clear, TMs without erythema, bulging. No erythema, swelling, or exudate on post pharynx.  Tonsils not swollen or erythematous. Hearing normal.  Neck: Supple, thyroid normal.  Respiratory: Respiratory effort normal, BS equal bilaterally without rales, rhonchi, wheezing or stridor.  Cardio: RRR with no MRGs. Brisk peripheral pulses without edema.  Abdomen: Soft, + BS.  Non tender, no guarding, rebound, hernias, masses. Lymphatics: Non tender without lymphadenopathy.  Musculoskeletal: Full ROM, 5/5 strength, {PSY - GAIT AND STATION:22860} gait Skin: Warm, dry without rashes, lesions, ecchymosis.  Neuro: Cranial nerves intact. No cerebellar symptoms.  Psych: Awake and oriented X 3, normal affect, Insight and Judgment appropriate.    Izora Ribas, NP 12:39 PM [facility]

## 2018-01-18 ENCOUNTER — Ambulatory Visit: Payer: Self-pay | Admitting: Adult Health

## 2018-01-19 ENCOUNTER — Ambulatory Visit (INDEPENDENT_AMBULATORY_CARE_PROVIDER_SITE_OTHER): Payer: 59 | Admitting: Adult Health

## 2018-01-19 ENCOUNTER — Encounter: Payer: Self-pay | Admitting: Adult Health

## 2018-01-19 VITALS — BP 154/94 | HR 112 | Temp 97.7°F | Wt 143.0 lb

## 2018-01-19 DIAGNOSIS — Z79899 Other long term (current) drug therapy: Secondary | ICD-10-CM | POA: Diagnosis not present

## 2018-01-19 MED ORDER — BISOPROLOL-HYDROCHLOROTHIAZIDE 5-6.25 MG PO TABS: ORAL_TABLET | ORAL | 1 refills | Status: DC

## 2018-01-19 NOTE — Progress Notes (Signed)
FOLLOW UP  Assessment and Plan:   Pulmonary hypertension RVSP 45 (2010), CXR 2015 unremarkable- patient is asymptomatic, continue to monitor  Hypertension Still elevated with current medication- she would prefer to switch agents, ? Dry cough; will try combination agent - ziac 5-6.26 mg 1 tab daily, increase to 2 tabs if still running above goal in 2 weeks.  Monitor blood pressure at home; patient to call if consistently greater than 130/80 Continue DASH diet.   Reminder to go to the ER if any CP, SOB, nausea, dizziness, severe HA, changes vision/speech, left arm numbness and tingling and jaw pain.  BMI 24 Long discussion about weight loss, diet, and exercise Recommended diet heavy in fruits and veggies and low in animal meats, cheeses, and dairy products, appropriate calorie intake Discussed ideal weight for height  Will follow up in 3 months  Vitamin D Def continue supplementation  Defer Vit D level to CPE  Continue diet and meds as discussed. Further disposition pending results of labs. Discussed med's effects and SE's.   Over 30 minutes of exam, counseling, chart review, and critical decision making was performed.   No future appointments.  ----------------------------------------------------------------------------------------------------------------------  HPI 47 y.o. female  presents for 6+ month follow up on hypertension, weight and vitamin D deficiency. Patient was noted to have pulmonary htn on ECHO 2010 (RVSP 45), CXR 2015 unremarkable- patient is asymptomatic.   BMI is Body mass index is 24.55 kg/m., she has been working on diet, could be doing better, she does lift boxes at work and sweats a fair bit.  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 She reports she would prefer to switch from current agent.   She does workout. She denies chest pain, shortness of breath, dizziness.  Patient is on Vitamin D supplement.   Lab Results  Component Value Date   VD25OH 31 03/08/2017       Current Medications:  Current Outpatient Medications on File Prior to Visit  Medication Sig  . benazepril (LOTENSIN) 20 MG tablet Take 1 tab by mouth daily for BP.  Marland Kitchen Cholecalciferol (VITAMIN D-3) 5000 UNITS TABS Take 1 tablet by mouth every evening. Take 10,000 IU every other day  . potassium chloride (K-DUR) 10 MEQ tablet Take 1 tablet (10 mEq total) by mouth daily.  . hydrochlorothiazide (HYDRODIURIL) 25 MG tablet Take 0.5 tablets (12.5 mg total) by mouth daily. Take 1 tab daily (Patient not taking: Reported on 01/19/2018)  . meloxicam (MOBIC) 15 MG tablet Take 1 tablet (15 mg total) by mouth daily. (Patient not taking: Reported on 01/19/2018)   No current facility-administered medications on file prior to visit.      Allergies: No Known Allergies   Medical History:  Past Medical History:  Diagnosis Date  . Anemia   . Fibroids   . Hypertension   . Hypokalemia   . Hypomagnesemia   . Pulmonary hypertension ([place]) Via Echo 2010   EF 65% RVSP 45 (Dr. Stanford Breed)  . Vitamin D deficiency    Family history- Reviewed and unchanged Social history- Reviewed and unchanged   Review of Systems:  Review of Systems  Constitutional: Negative for  malaise/fatigue and weight loss.  HENT: Negative for hearing loss and tinnitus.   Eyes: Negative for blurred vision and double vision.  Respiratory: Negative for cough, shortness of breath and wheezing.   Cardiovascular: Negative for chest pain, palpitations, orthopnea, claudication and leg swelling.  Gastrointestinal: Negative for abdominal pain, blood in stool, constipation, diarrhea, heartburn, melena, nausea and  vomiting.  Genitourinary: Negative.   Musculoskeletal: Negative for joint pain and myalgias.  Skin: Negative for rash.  Neurological: Negative for dizziness, tingling, sensory change, weakness and headaches.  Endo/Heme/Allergies: Negative for polydipsia.  Psychiatric/Behavioral: Negative.   All other systems reviewed and are negative.   Physical Exam: BP (!) 154/94   Pulse (!) 112   Temp 97.7 F (36.5 C)   Wt 143 lb (64.9 kg)   SpO2 99%   BMI 24.55 kg/m  Wt Readings from Last 3 Encounters:  01/19/18 143 lb (64.9 kg)  10/07/15 135 lb 9.6 oz (61.5 kg)  08/22/15 136 lb (61.7 kg)   General Appearance: Well nourished, in no apparent distress. Eyes: PERRLA, EOMs, conjunctiva no swelling or erythema Sinuses: No Frontal/maxillary tenderness ENT/Mouth: Ext aud canals clear, TMs without erythema, bulging. No erythema, swelling, or exudate on post pharynx.  Tonsils not swollen or erythematous. Hearing normal.  Neck: Supple, thyroid normal.  Respiratory: Respiratory effort normal, BS equal bilaterally without rales, rhonchi, wheezing or stridor.  Cardio: RRR with no MRGs. Brisk peripheral pulses without edema.  Abdomen: Soft, + BS.  Non tender, no guarding, rebound, hernias, masses. Lymphatics: Non tender without lymphadenopathy.  Musculoskeletal: Full ROM, 5/5 strength, Normal gait Skin: Warm, dry without rashes, lesions, ecchymosis.  Neuro: Cranial nerves intact. No cerebellar symptoms.  Psych: Awake and oriented X 3, normal affect, Insight and Judgment appropriate.    Izora Ribas, NP 4:51 PM [facility]

## 2018-01-19 NOTE — Patient Instructions (Addendum)
I am starting you on a combination medication - bisoprolol + very low dose hydrochlorothiazide - start with 1 tab daily, check BP routinely for goal <130/80  Can increase to 2 tabs if still above goal in a few weeks  Monitor your blood pressure at home, please keep a record and bring that in with you to your next office visit.   Go to the ER if any CP, SOB, nausea, dizziness, severe HA, changes vision/speech  Your most recent BP: BP: (!) 154/94   Take your medications faithfully as instructed. Maintain a healthy weight. Get at least 150 minutes of aerobic exercise per week. Minimize salt intake. Minimize alcohol intake  DASH Eating Plan DASH stands for "Dietary Approaches to Stop Hypertension." The DASH eating plan is a healthy eating plan that has been shown to reduce high blood pressure (hypertension). Additional health benefits may include reducing the risk of type 2 diabetes mellitus, heart disease, and stroke. The DASH eating plan may also help with weight loss. WHAT DO I NEED TO KNOW ABOUT THE DASH EATING PLAN? For the DASH eating plan, you will follow these general guidelines:  Choose foods with a percent daily value for sodium of less than 5% (as listed on the food label).  Use salt-free seasonings or herbs instead of table salt or sea salt.  Check with your health care provider or pharmacist before using salt substitutes.  Eat lower-sodium products, often labeled as "lower sodium" or "no salt added."  Eat fresh foods.  Eat more vegetables, fruits, and low-fat dairy products.  Choose whole grains. Look for the word "whole" as the first word in the ingredient list.  Choose fish and skinless chicken or [place] more often than red meat. Limit fish, poultry, and meat to 6 oz (170 g) each day.  Limit sweets, desserts, sugars, and sugary drinks.  Choose heart-healthy fats.  Limit cheese to 1 oz (28 g) per day.  Eat more home-cooked food and less restaurant, buffet, and  fast food.  Limit fried foods.  Cook foods using methods other than frying.  Limit canned vegetables. If you do use them, rinse them well to decrease the sodium.  When eating at a restaurant, ask that your food be prepared with less salt, or no salt if possible. WHAT FOODS CAN I EAT? Seek help from a dietitian for individual calorie needs. Grains Whole grain or whole wheat bread. Brown rice. Whole grain or whole wheat pasta. Quinoa, bulgur, and whole grain cereals. Low-sodium cereals. Corn or whole wheat flour tortillas. Whole grain cornbread. Whole grain crackers. Low-sodium crackers. Vegetables Fresh or frozen vegetables (raw, steamed, roasted, or grilled). Low-sodium or reduced-sodium tomato and vegetable juices. Low-sodium or reduced-sodium tomato sauce and paste. Low-sodium or reduced-sodium canned vegetables.  Fruits All fresh, canned (in natural juice), or frozen fruits. Meat and Other Protein Products Ground beef (85% or leaner), grass-fed beef, or beef trimmed of fat. Skinless chicken or [place]. Ground chicken or [place]. Pork trimmed of fat. All fish and seafood. Eggs. Dried beans, peas, or lentils. Unsalted nuts and seeds. Unsalted canned beans. Dairy Low-fat dairy products, such as skim or 1% milk, 2% or reduced-fat cheeses, low-fat ricotta or cottage cheese, or plain low-fat yogurt. Low-sodium or reduced-sodium cheeses. Fats and Oils Tub margarines without trans fats. Light or reduced-fat mayonnaise and salad dressings (reduced sodium). Avocado. Safflower, olive, or canola oils. Natural peanut or almond butter. Other Unsalted popcorn and pretzels. The items listed above may not be a complete list of  recommended foods or beverages. Contact your dietitian for more options. WHAT FOODS ARE NOT RECOMMENDED? Grains White bread. White pasta. White rice. Refined cornbread. Bagels and croissants. Crackers that contain trans fat. Vegetables Creamed or fried vegetables. Vegetables in a  cheese sauce. Regular canned vegetables. Regular canned tomato sauce and paste. Regular tomato and vegetable juices. Fruits Dried fruits. Canned fruit in light or heavy syrup. Fruit juice. Meat and Other Protein Products Fatty cuts of meat. Ribs, chicken wings, bacon, sausage, bologna, salami, chitterlings, fatback, hot dogs, bratwurst, and packaged luncheon meats. Salted nuts and seeds. Canned beans with salt. Dairy Whole or 2% milk, cream, half-and-half, and cream cheese. Whole-fat or sweetened yogurt. Full-fat cheeses or blue cheese. Nondairy creamers and whipped toppings. Processed cheese, cheese spreads, or cheese curds. Condiments Onion and garlic salt, seasoned salt, table salt, and sea salt. Canned and packaged gravies. Worcestershire sauce. Tartar sauce. Barbecue sauce. Teriyaki sauce. Soy sauce, including reduced sodium. Steak sauce. Fish sauce. Oyster sauce. Cocktail sauce. Horseradish. Ketchup and mustard. Meat flavorings and tenderizers. Bouillon cubes. Hot sauce. Tabasco sauce. Marinades. Taco seasonings. Relishes. Fats and Oils Butter, stick margarine, lard, shortening, ghee, and bacon fat. Coconut, palm kernel, or palm oils. Regular salad dressings. Other Pickles and olives. Salted popcorn and pretzels. The items listed above may not be a complete list of foods and beverages to avoid. Contact your dietitian for more information. WHERE CAN I FIND MORE INFORMATION? National Heart, Lung, and Blood Institute: travelstabloid.com Document Released: 05/07/2011 Document Revised: 10/02/2013 Document Reviewed: 03/22/2013 [facility] Patient Information 2015 [place], [place]. This information is not intended to replace advice given to you by your health care provider. Make sure you discuss any questions you have with your health care provider.      Bisoprolol; Hydrochlorothiazide, HCTZ tablets What is this medicine? BISOPROLOL; HYDROCHLOROTHIAZIDE (bis OH  proe lol; hye droe klor oh THYE a zide) is a combination of a beta-blocker and a diuretic. It is used to treat high blood pressure. This medicine may be used for other purposes; ask your health care provider or pharmacist if you have questions. COMMON BRAND NAME(S): Ziac What should I tell my health care provider before I take this medicine? They need to know if you have any of these conditions: -circulation problems, or blood vessel disease -decreased urine -diabetes -heart disease, heart failure or a history of heart attack -kidney disease -liver disease -lung or breathing disease, like asthma -slow heart rate -thyroid disease -an unusual or allergic reaction to hydrochlorothiazide, bisoprolol, sulfa drugs, other medicines, foods, dyes, or preservatives -pregnant or trying to get pregnant -breast-feeding How should I use this medicine? Take this medicine by mouth with a glass of water. Follow the directions on the prescription label. You can take it with or without food. If it upsets your stomach, take it with food. Take your medicine at regular intervals. Do not take it more often than directed. Do not stop taking except on your doctor's advice. Talk to your pediatrician regarding the use of this medicine in children. Special care may be needed. Overdosage: If you think you have taken too much of this medicine contact a poison control center or emergency room at once. NOTE: This medicine is only for you. Do not share this medicine with others. What if I miss a dose? If you miss a dose, take it as soon as you can. If it is almost time for your next dose, take only that dose. Do not take double or extra doses. What may  interact with this medicine? -barbiturates, like phenobarbital -cholestyramine -colestipol -corticosteroids, like prednisone -lithium -medicines for chest pain or angina -medicines for diabetes -medicines for high blood pressure or heart failure -medicines to control  heart rhythm -NSAIDs, medicines for pain and inflammation, like ibuprofen or naproxen -prescription pain medicines -rifampin -skeletal muscle relaxants like tubocurarine This list may not describe all possible interactions. Give your health care provider a list of all the medicines, herbs, non-prescription drugs, or dietary supplements you use. Also tell them if you smoke, drink alcohol, or use illegal drugs. Some items may interact with your medicine. What should I watch for while using this medicine? Visit your doctor or health care professional for regular checks on your progress. Check your blood pressure as directed. Ask your doctor or health care professional what your blood pressure should be and when you should contact him or her. Check with your doctor or health care professional if you get an attack of severe diarrhea, nausea and vomiting, or if you sweat a lot. The loss of too much body fluid can make it dangerous for you to take this medicine. You may get drowsy or dizzy. Do not drive, use machinery, or do anything that needs mental alertness until you know how this drug affects you. Do not stand or sit up quickly, especially if you are an older patient. This reduces the risk of dizzy or fainting spells. Alcohol can make you more drowsy and dizzy. Avoid alcoholic drinks. This medicine may affect your blood sugar level. If you have diabetes, check with your doctor or health care professional before changing the dose of your diabetic medicine. This medicine can make you more sensitive to the sun. Keep out of the sun. If you cannot avoid being in the sun, wear protective clothing and use sunscreen. Do not use sun lamps or tanning beds/booths. Do not treat yourself for coughs, colds, or pain while you are taking this medicine without asking your doctor or health care professional for advice. Some ingredients may increase your blood pressure. What side effects may I notice from receiving this  medicine? Side effects that you should report to your doctor or health care professional as soon as possible: -allergic reactions like skin rash, itching or hives, swelling of the face, lips, or tongue -breathing problems -changes in vision -chest pain -cold, tingling, or numb hands or feet -eye pain -fast, irregular, or slow heartbeat -increased thirst or sweating -muscle cramps -redness, blistering, peeling or loosening of the skin, including inside the mouth -swollen legs or ankles -tremors -unusual bruising -unusual weak or tired -vomiting -worsened gout pain -yellowing of the eyes or skin Side effects that usually do not require medical attention (report to your doctor or health care professional if they continue or are bothersome): -change in sex drive or performance -cough -depression -diarrhea -nausea This list may not describe all possible side effects. Call your doctor for medical advice about side effects. You may report side effects to FDA at 1-800-FDA-1088. Where should I keep my medicine? Keep out of the reach of children. Store at room temperature between 15 and 30 degrees C (59 and 86 degrees F). Keep container tightly closed. Throw away any unused medicine after the expiration date. NOTE: This sheet is a summary. It may not cover all possible information. If you have questions about this medicine, talk to your doctor, pharmacist, or health care provider.  2018 Elsevier/Gold Standard (2010-02-05 12:53:54)

## 2018-01-20 LAB — COMPLETE METABOLIC PANEL WITH GFR
AG RATIO: 1.3 (calc) (ref 1.0–2.5)
ALBUMIN MSPROF: 4.2 g/dL (ref 3.6–5.1)
ALKALINE PHOSPHATASE (APISO): 77 U/L (ref 33–115)
ALT: 9 U/L (ref 6–29)
AST: 16 U/L (ref 10–35)
BILIRUBIN TOTAL: 0.6 mg/dL (ref 0.2–1.2)
BUN: 14 mg/dL (ref 7–25)
CHLORIDE: 105 mmol/L (ref 98–110)
CO2: 27 mmol/L (ref 20–32)
CREATININE: 0.77 mg/dL (ref 0.50–1.10)
Calcium: 9.2 mg/dL (ref 8.6–10.2)
GFR, Est African American: 107 mL/min/{1.73_m2} (ref >=60)
GFR, Est Non African American: 93 mL/min/{1.73_m2} (ref >=60)
GLOBULIN: 3.2 g/dL (ref 1.9–3.7)
Glucose, Bld: 91 mg/dL (ref 65–99)
POTASSIUM: 3.6 mmol/L (ref 3.5–5.3)
SODIUM: 139 mmol/L (ref 135–146)
Total Protein: 7.4 g/dL (ref 6.1–8.1)

## 2018-01-20 LAB — CBC WITH DIFFERENTIAL/PLATELET
BASOS ABS: 19 {cells}/uL (ref 0–200)
BASOS PCT: 0.4 %
EOS ABS: 141 {cells}/uL (ref 15–500)
Eosinophils Relative: 3 %
HCT: 32.6 % — ABNORMAL LOW (ref 35.0–45.0)
HEMOGLOBIN: 10.5 g/dL — AB (ref 11.7–15.5)
Lymphs Abs: 1349 cells/uL (ref 850–3900)
MCH: 27.3 pg (ref 27.0–33.0)
MCHC: 32.2 g/dL (ref 32.0–36.0)
MCV: 84.9 fL (ref 80.0–100.0)
MPV: 11.2 fL (ref 7.5–12.5)
Monocytes Relative: 12.9 %
NEUTROS ABS: 2585 {cells}/uL (ref 1500–7800)
Neutrophils Relative %: 55 %
Platelets: 275 10*3/uL (ref 140–400)
RBC: 3.84 10*6/uL (ref 3.80–5.10)
RDW: 12.6 % (ref 11.0–15.0)
Total Lymphocyte: 28.7 %
WBC: 4.7 10*3/uL (ref 3.8–10.8)
WBCMIX: 606 {cells}/uL (ref 200–950)

## 2018-02-16 ENCOUNTER — Ambulatory Visit: Payer: Self-pay

## 2018-05-02 NOTE — Progress Notes (Deleted)
Complete Physical  Assessment and Plan:  Diagnoses and all orders for this visit:  Encounter for routine adult health examination with abnormal findings  Pulmonary hypertension ([place])  Essential hypertension  Uterine leiomyoma, unspecified location  Vitamin D deficiency  Iron deficiency anemia, unspecified iron deficiency anemia type  BMI 23.0-23.9, adult  Screening cholesterol level  Screening for diabetes mellitus  Screening for thyroid disorder     Discussed med's effects and SE's. Screening labs and tests as requested with regular follow-up as recommended. Over 40 minutes of exam, counseling, chart review, and complex, high level critical decision making was performed this visit.   Future Appointments  Date Time Provider [place]  05/04/2018  3:00 PM Liane Comber, NP [facility] None     HPI  47 y.o. female  presents for a complete physical and follow up for has Hypertension; Vitamin D deficiency; Anemia; Fibroid, uterine; and Pulmonary hypertension ([place]) on their problem list..  Patient also has documented history of pulmonary hypertension apparently noted on an ECHO in 2010- patient is unaware of this diagnosis, but denies exertional dyspnea, lethargy, fatigue, snoring, AM headaches. *** follow up?   BMI is There is no height or weight on file to calculate BMI., she {HAS HAS EQA:83419} been working on diet and exercise. Wt Readings from Last 3 Encounters:  01/19/18 143 lb (64.9 kg)  10/07/15 135 lb 9.6 oz (61.5 kg)  08/22/15 136 lb (61.7 kg)   Her blood pressure {HAS HAS NOT:18834} been controlled at home, today their BP is   what is she taking *** She {DOES_DOES QQI:29798} workout. She denies chest pain, shortness of breath, dizziness.   She is not on cholesterol medication and denies myalgias. Her cholesterol is at goal. The cholesterol last visit was:   Lab Results  Component Value Date   CHOL 175 10/07/2015   HDL 75 10/07/2015   LDLCALC 87  10/07/2015   TRIG 66 10/07/2015   CHOLHDL 2.3 10/07/2015   She {Has/has not:18111} been working on diet and exercise for glucose management, and denies {Symptoms; diabetes w/o none:19199}. Last A1C in the office was:  Lab Results  Component Value Date   HGBA1C 5.4 10/07/2015   Last GFR: Lab Results  Component Value Date   GFRAA 107 01/19/2018   Patient is on Vitamin D supplement.   Lab Results  Component Value Date   VD25OH 31 03/08/2017      Current Medications:  Current Outpatient Medications on File Prior to Visit  Medication Sig Dispense Refill  . benazepril (LOTENSIN) 20 MG tablet Take 1 tab by mouth daily for BP. 30 tablet 0  . bisoprolol-hydrochlorothiazide (ZIAC) 5-6.25 MG tablet Start by taking 1 tab daily for BP goal <130/80. If still running above goal, can increase to 2 tabs daily in 2 weeks. 90 tablet 1  . Cholecalciferol (VITAMIN D-3) 5000 UNITS TABS Take 1 tablet by mouth every evening. Take 10,000 IU every other day    . hydrochlorothiazide (HYDRODIURIL) 25 MG tablet Take 0.5 tablets (12.5 mg total) by mouth daily. Take 1 tab daily (Patient not taking: Reported on 01/19/2018) 90 tablet 1  . meloxicam (MOBIC) 15 MG tablet Take 1 tablet (15 mg total) by mouth daily. (Patient not taking: Reported on 01/19/2018) 90 tablet 1  . potassium chloride (K-DUR) 10 MEQ tablet Take 1 tablet (10 mEq total) by mouth daily. 30 tablet 1   No current facility-administered medications on file prior to visit.    Allergies:  No Known Allergies Medical  History:  She has Hypertension; Vitamin D deficiency; Anemia; Fibroid, uterine; and Pulmonary hypertension ([place]) on their problem list. Health Maintenance:   Immunization History  Administered Date(s) Administered  . Pneumococcal-Unspecified 06/14/2001  . Td 06/14/2001  . Tdap 06/15/2013   Tetanus: 2015 Pneumovax: 2003 Flu vaccine: declines Zostavax: N/A  Pap: 2012 see OB/GYN MGM: will get at OB/GYN DEXA: N/A Colonoscopy:  N/A EGD: N/A Echo: 2010 PHTN 45, EF 65%  Vision- unknown name, last *** Dentist- Dr. Arrie Aran q 6 months, last ***  Patient Care Team: Unk Pinto, MD as PCP - General (Internal Medicine)  Surgical History:  She has a past surgical history that includes Uterine artery embolization (04/27/2014) and ir generic historical (07/04/2014). Family History:  Herfamily history includes Hyperlipidemia in her father and mother; Hypertension in her father and mother. Social History:  She reports that she has never smoked. She has never used smokeless tobacco. She reports that she does not drink alcohol or use drugs.  Review of Systems: Review of Systems  Constitutional: Negative for malaise/fatigue and weight loss.  HENT: Negative for hearing loss and tinnitus.   Eyes: Negative for blurred vision and double vision.  Respiratory: Negative for cough, shortness of breath and wheezing.   Cardiovascular: Negative for chest pain, palpitations, orthopnea, claudication and leg swelling.  Gastrointestinal: Negative for abdominal pain, blood in stool, constipation, diarrhea, heartburn, melena, nausea and vomiting.  Genitourinary: Negative.   Musculoskeletal: Negative for joint pain and myalgias.  Skin: Negative for rash.  Neurological: Negative for dizziness, tingling, sensory change, weakness and headaches.  Endo/Heme/Allergies: Negative for polydipsia.  Psychiatric/Behavioral: Negative.   All other systems reviewed and are negative.   Physical Exam: Estimated body mass index is 24.55 kg/m as calculated from the following:   Height as of 10/07/15: 5\' 4"  (1.626 m).   Weight as of 01/19/18: 143 lb (64.9 kg). There were no vitals taken for this visit. General Appearance: Well nourished, in no apparent distress.  Eyes: PERRLA, EOMs, conjunctiva no swelling or erythema, normal fundi and vessels.  Sinuses: No Frontal/maxillary tenderness  ENT/Mouth: Ext aud canals clear, normal light reflex with TMs  without erythema, bulging. Good dentition. No erythema, swelling, or exudate on post pharynx. Tonsils not swollen or erythematous. Hearing normal.  Neck: Supple, thyroid normal. No bruits  Respiratory: Respiratory effort normal, BS equal bilaterally without rales, rhonchi, wheezing or stridor.  Cardio: RRR without murmurs, rubs or gallops. Brisk peripheral pulses without edema.  Chest: symmetric, with normal excursions and percussion.  Breasts: Symmetric, without lumps, nipple discharge, retractions.  Abdomen: Soft, nontender, no guarding, rebound, hernias, masses, or organomegaly.  Lymphatics: Non tender without lymphadenopathy.  Genitourinary:  Musculoskeletal: Full ROM all peripheral extremities,5/5 strength, and normal gait.  Skin: Warm, dry without rashes, lesions, ecchymosis. Neuro: Cranial nerves intact, reflexes equal bilaterally. Normal muscle tone, no cerebellar symptoms. Sensation intact.  Psych: Awake and oriented X 3, normal affect, Insight and Judgment appropriate.   EKG: WNL no ST changes. ***  Gorden Harms Sudeep Scheibel 9:30 AM [facility]

## 2018-05-04 ENCOUNTER — Encounter: Payer: Self-pay | Admitting: Adult Health

## 2018-05-18 NOTE — Progress Notes (Signed)
Complete Physical  Assessment and Plan:  Dawn Pope was seen today for annual exam.  Diagnoses and all orders for this visit:  Encounter for routine adult health examination without abnormal findings  Pulmonary hypertension ([place]) Asymptomatic; continue to monitor; patient declines further workup/follow up  Essential hypertension Continue medication Monitor blood pressure at home; call if consistently over 130/80 Continue DASH diet.   Reminder to go to the ER if any CP, SOB, nausea, dizziness, severe HA, changes vision/speech, left arm numbness and tingling and jaw pain. -     CBC with Differential/Platelet -     COMPLETE METABOLIC PANEL WITH GFR -     Magnesium -     Urinalysis, Routine w reflex microscopic -     Microalbumin / creatinine urine ratio -     EKG 12-Lead  Uterine leiomyoma, unspecified location Follow up with GYN  Vitamin D deficiency -     VITAMIN D 25 Hydroxy (Vit-D Deficiency, Fractures)  Iron deficiency anemia, unspecified iron deficiency anemia type -     CBC with Differential/Platelet -     Iron,Total/Total Iron Binding Cap  Screening cholesterol level -     Lipid panel  Screening for thyroid disorder -     TSH  Screening for diabetes mellitus -     Hemoglobin A1c   Discussed med's effects and SE's. Screening labs and tests as requested with regular follow-up as recommended. Over 40 minutes of exam, counseling, chart review, and complex, high level critical decision making was performed this visit.   Future Appointments  Date Time Provider [place]  05/22/2019  9:00 AM Liane Comber, NP [facility] None     HPI  47 y.o. female  presents for a complete physical and follow up for has Hypertension; Vitamin D deficiency; Anemia; Fibroid, uterine; and Pulmonary hypertension ([place]) on their problem list..  Patient also has documented history of pulmonary hypertension apparently noted on an ECHO in 2010- patient is unaware of this diagnosis,  but denies exertional dyspnea, lethargy, fatigue, snoring, AM headaches. She declines follow up on this.   BMI is Body mass index is 26.12 kg/m., she has been working on diet, avoids red meat, pushes fruits/vegetable intake, drinks protein drinks twice weekly. 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 She reports drinking 2 bottles of water daily.  Wt Readings from Last 3 Encounters:  05/19/18 149 lb 12.8 oz (67.9 kg)  01/19/18 143 lb (64.9 kg)  10/07/15 135 lb 9.6 oz (61.5 kg)   Her blood pressure has been controlled at home, today their BP is BP: 124/78  She does not workout. She denies chest pain, shortness of breath, dizziness.   She is not on cholesterol medication and denies myalgias. Her cholesterol is at goal. The cholesterol last visit was:   Lab Results  Component Value Date   CHOL 175 10/07/2015   HDL 75 10/07/2015   LDLCALC 87 10/07/2015   TRIG 66 10/07/2015   CHOLHDL 2.3 10/07/2015   She has been working on diet and exercise for glucose management, and denies increased appetite, nausea, paresthesia of the feet, polydipsia, polyuria and visual disturbances. Last A1C in the office was:  Lab Results  Component Value Date   HGBA1C 5.4 10/07/2015   Last GFR: Lab Results  Component Value Date   GFRAA 107 01/19/2018   Patient is on Vitamin D supplement, she takes 10000 every other day.  Lab Results  Component Value  Date   VD25OH 31 03/08/2017      Current Medications:  Current Outpatient Medications on File Prior to Visit  Medication Sig Dispense Refill  . bisoprolol-hydrochlorothiazide (ZIAC) 5-6.25 MG tablet Start by taking 1 tab daily for BP goal <130/80. If still running above goal, can increase to 2 tabs daily in 2 weeks. 90 tablet 1  . Cholecalciferol (VITAMIN D-3) 5000 UNITS TABS Take 1 tablet by mouth every evening. Take 10,000 IU every other day     No current facility-administered  medications on file prior to visit.    Allergies:  No Known Allergies Medical History:  She has Hypertension; Vitamin D deficiency; Anemia; Fibroid, uterine; and Pulmonary hypertension ([place]) on their problem list. Health Maintenance:   Immunization History  Administered Date(s) Administered  . Pneumococcal-Unspecified 06/14/2001  . Td 06/14/2001  . Tdap 06/15/2013   Tetanus: 2015 Pneumovax: 2003 Flu vaccine: declines Zostavax: N/A  Pap: 2015/6 see OB/GYN MGM: will get at OB/GYN DEXA: N/A Colonoscopy: N/A EGD: N/A Echo: 2010 PHTN 25, EF 65%  Vision- unknown name, last remote Dentist- Dr. Arrie Aran q 6 months, last 2019  Patient Care Team: Unk Pinto, MD as PCP - General (Internal Medicine)  Surgical History:  She has a past surgical history that includes Uterine artery embolization (04/27/2014) and ir generic historical (07/04/2014). Family History:  Herfamily history includes Hyperlipidemia in her father and mother; Hypertension in her father and mother; Stroke (age of onset: 51) in her father. Social History:  She reports that she has never smoked. She has never used smokeless tobacco. She reports that she does not drink alcohol or use drugs.  Review of Systems: Review of Systems  Constitutional: Negative for malaise/fatigue and weight loss.  HENT: Negative for hearing loss and tinnitus.   Eyes: Negative for blurred vision and double vision.  Respiratory: Negative for cough, shortness of breath and wheezing.   Cardiovascular: Negative for chest pain, palpitations, orthopnea, claudication and leg swelling.  Gastrointestinal: Negative for abdominal pain, blood in stool, constipation, diarrhea, heartburn, melena, nausea and vomiting.  Genitourinary: Negative.   Musculoskeletal: Negative for joint pain and myalgias.  Skin: Negative for rash.  Neurological: Negative for dizziness, tingling, sensory change, weakness and headaches.  Endo/Heme/Allergies: Negative for  polydipsia.  Psychiatric/Behavioral: Negative.   All other systems reviewed and are negative.   Physical Exam: Estimated body mass index is 26.12 kg/m as calculated from the following:   Height as of this encounter: 5' 3.5" (1.613 m).   Weight as of this encounter: 149 lb 12.8 oz (67.9 kg). BP 124/78   Pulse 74   Temp (!) 97.3 F (36.3 C)   Ht 5' 3.5" (1.613 m)   Wt 149 lb 12.8 oz (67.9 kg)   LMP 05/03/2018 (Approximate)   SpO2 99%   BMI 26.12 kg/m  General Appearance: Well nourished, in no apparent distress.  Eyes: PERRLA, EOMs, conjunctiva no swelling or erythema, normal fundi and vessels.  Sinuses: No Frontal/maxillary tenderness  ENT/Mouth: Ext aud canals clear, normal light reflex with TMs without erythema, bulging. Good dentition. No erythema, swelling, or exudate on post pharynx. Tonsils not swollen or erythematous. Hearing normal.  Neck: Supple, thyroid normal. No bruits  Respiratory: Respiratory effort normal, BS equal bilaterally without rales, rhonchi, wheezing or stridor.  Cardio: RRR without murmurs, rubs or gallops. Brisk peripheral pulses without edema.  Chest: symmetric, with normal excursions and percussion.  Breasts: Symmetric, without lumps, nipple discharge, retractions.  Abdomen: Soft, nontender, no guarding, rebound, hernias, masses, or  organomegaly.  Lymphatics: Non tender without lymphadenopathy.  Genitourinary: Defer to GYN Musculoskeletal: Full ROM all peripheral extremities,5/5 strength, and normal gait.  Skin: Warm, dry without rashes, lesions, ecchymosis. Neuro: Cranial nerves intact, reflexes equal bilaterally. Normal muscle tone, no cerebellar symptoms. Sensation intact.  Psych: Awake and oriented X 3, normal affect, Insight and Judgment appropriate.   EKG: WNL no ST changes.   Izora Ribas 9:28 AM [facility]

## 2018-05-19 ENCOUNTER — Ambulatory Visit (INDEPENDENT_AMBULATORY_CARE_PROVIDER_SITE_OTHER): Payer: 59 | Admitting: Adult Health

## 2018-05-19 ENCOUNTER — Encounter: Payer: Self-pay | Admitting: Adult Health

## 2018-05-19 VITALS — BP 124/78 | HR 74 | Temp 97.3°F | Ht 63.5 in | Wt 149.8 lb

## 2018-05-19 DIAGNOSIS — D259 Leiomyoma of uterus, unspecified: Secondary | ICD-10-CM

## 2018-05-19 DIAGNOSIS — I272 Pulmonary hypertension, unspecified: Secondary | ICD-10-CM

## 2018-05-19 DIAGNOSIS — D509 Iron deficiency anemia, unspecified: Secondary | ICD-10-CM

## 2018-05-19 DIAGNOSIS — I10 Essential (primary) hypertension: Secondary | ICD-10-CM

## 2018-05-19 DIAGNOSIS — E559 Vitamin D deficiency, unspecified: Secondary | ICD-10-CM | POA: Diagnosis not present

## 2018-05-19 DIAGNOSIS — Z131 Encounter for screening for diabetes mellitus: Secondary | ICD-10-CM

## 2018-05-19 DIAGNOSIS — Z136 Encounter for screening for cardiovascular disorders: Secondary | ICD-10-CM

## 2018-05-19 DIAGNOSIS — Z1329 Encounter for screening for other suspected endocrine disorder: Secondary | ICD-10-CM

## 2018-05-19 DIAGNOSIS — Z13220 Encounter for screening for lipoid disorders: Secondary | ICD-10-CM

## 2018-05-19 DIAGNOSIS — Z0000 Encounter for general adult medical examination without abnormal findings: Secondary | ICD-10-CM

## 2018-05-19 NOTE — Patient Instructions (Addendum)
   Dawn Pope , Thank you for taking time to come for your Medicare Wellness Visit. I appreciate your ongoing commitment to your health goals. Please review the following plan we discussed and let me know if I can assist you in the future.   These are the goals we discussed: Goals    . Blood Pressure < 130/80    . Weight (lb) < 140 lb (63.5 kg)       This is a list of the screening recommended for you and due dates:  Health Maintenance  Topic Date Due  . Pap Smear  06/14/2013  . Flu Shot  08/30/2018*  . HIV Screening  05/20/2019*  . Tetanus Vaccine  06/16/2023  *Topic was postponed. The date shown is not the original due date.    HOW TO SCHEDULE A MAMMOGRAM  [facility]  7 a.m.-6:30 p.m., Monday 7 a.m.-5 p.m., Tuesday-Friday Schedule an appointment by calling [phone].  [facility] Schedule an appointment by calling [phone].   Schedule GYN visit  Schedule vision check    Know what a healthy weight is for you (roughly BMI <25) and aim to maintain this  Aim for 7+ servings of fruits and vegetables daily  65-80+ fluid ounces of water or unsweet tea for healthy kidneys  Limit to max 1 drink of alcohol per day; avoid smoking/tobacco  Limit animal fats in diet for cholesterol and heart health - choose grass fed whenever available  Avoid highly processed foods, and foods high in saturated/trans fats  Aim for low stress - take time to unwind and care for your mental health  Aim for 150 min of moderate intensity exercise weekly for heart health, and weights twice weekly for bone health  Aim for 7-9 hours of sleep daily

## 2018-05-20 LAB — CBC WITH DIFFERENTIAL/PLATELET
Absolute Monocytes: 619 cells/uL (ref 200–950)
Basophils Absolute: 20 cells/uL (ref 0–200)
Basophils Relative: 0.3 %
Eosinophils Absolute: 88 cells/uL (ref 15–500)
Eosinophils Relative: 1.3 %
HCT: 34.5 % — ABNORMAL LOW (ref 35.0–45.0)
Hemoglobin: 11.5 g/dL — ABNORMAL LOW (ref 11.7–15.5)
Lymphs Abs: 1945 cells/uL (ref 850–3900)
MCH: 30.3 pg (ref 27.0–33.0)
MCHC: 33.3 g/dL (ref 32.0–36.0)
MCV: 91 fL (ref 80.0–100.0)
MPV: 11.2 fL (ref 7.5–12.5)
Monocytes Relative: 9.1 %
NEUTROS PCT: 60.7 %
Neutro Abs: 4128 cells/uL (ref 1500–7800)
Platelets: 251 10*3/uL (ref 140–400)
RBC: 3.79 10*6/uL — ABNORMAL LOW (ref 3.80–5.10)
RDW: 12.8 % (ref 11.0–15.0)
Total Lymphocyte: 28.6 %
WBC: 6.8 10*3/uL (ref 3.8–10.8)

## 2018-05-20 LAB — LIPID PANEL
Cholesterol: 175 mg/dL (ref <200)
HDL: 62 mg/dL (ref >50)
LDL Cholesterol (Calc): 99 mg/dL (calc)
Non-HDL Cholesterol (Calc): 113 mg/dL (calc) (ref <130)
Total CHOL/HDL Ratio: 2.8 (calc) (ref <5.0)
Triglycerides: 46 mg/dL (ref <150)

## 2018-05-20 LAB — COMPLETE METABOLIC PANEL WITH GFR
AG Ratio: 1.6 (calc) (ref 1.0–2.5)
ALT: 18 U/L (ref 6–29)
AST: 22 U/L (ref 10–35)
Albumin: 4.7 g/dL (ref 3.6–5.1)
Alkaline phosphatase (APISO): 66 U/L (ref 33–115)
BUN: 13 mg/dL (ref 7–25)
CO2: 26 mmol/L (ref 20–32)
CREATININE: 0.71 mg/dL (ref 0.50–1.10)
Calcium: 9.6 mg/dL (ref 8.6–10.2)
Chloride: 102 mmol/L (ref 98–110)
GFR, Est African American: 118 mL/min/{1.73_m2} (ref >=60)
GFR, Est Non African American: 101 mL/min/{1.73_m2} (ref >=60)
GLUCOSE: 90 mg/dL (ref 65–99)
Globulin: 2.9 g/dL (calc) (ref 1.9–3.7)
Potassium: 3.9 mmol/L (ref 3.5–5.3)
SODIUM: 136 mmol/L (ref 135–146)
Total Bilirubin: 0.7 mg/dL (ref 0.2–1.2)
Total Protein: 7.6 g/dL (ref 6.1–8.1)

## 2018-05-20 LAB — HEMOGLOBIN A1C
Hgb A1c MFr Bld: 5.4 % of total Hgb (ref <5.7)
MEAN PLASMA GLUCOSE: 108 (calc)
eAG (mmol/L): 6 (calc)

## 2018-05-20 LAB — URINALYSIS, ROUTINE W REFLEX MICROSCOPIC
BILIRUBIN URINE: NEGATIVE
Glucose, UA: NEGATIVE
Hgb urine dipstick: NEGATIVE
Ketones, ur: NEGATIVE
Leukocytes, UA: NEGATIVE
Nitrite: NEGATIVE
Protein, ur: NEGATIVE
Specific Gravity, Urine: 1.007 (ref 1.001–1.03)
pH: 5.5 (ref 5.0–8.0)

## 2018-05-20 LAB — MAGNESIUM: Magnesium: 1.9 mg/dL (ref 1.5–2.5)

## 2018-05-20 LAB — VITAMIN D 25 HYDROXY (VIT D DEFICIENCY, FRACTURES): Vit D, 25-Hydroxy: 42 ng/mL (ref 30–100)

## 2018-05-20 LAB — MICROALBUMIN / CREATININE URINE RATIO
Creatinine, Urine: 35 mg/dL (ref 20–275)
Microalb Creat Ratio: 9 mcg/mg creat (ref <30)
Microalb, Ur: 0.3 mg/dL

## 2018-05-20 LAB — IRON, TOTAL/TOTAL IRON BINDING CAP
%SAT: 23 % (ref 16–45)
Iron: 96 ug/dL (ref 40–190)
TIBC: 416 mcg/dL (calc) (ref 250–450)

## 2018-05-20 LAB — TSH: TSH: 1.8 mIU/L

## 2018-05-23 ENCOUNTER — Other Ambulatory Visit: Payer: Self-pay | Admitting: *Deleted

## 2018-05-23 MED ORDER — BISOPROLOL-HYDROCHLOROTHIAZIDE 5-6.25 MG PO TABS: ORAL_TABLET | ORAL | 1 refills | Status: DC

## 2018-11-16 NOTE — Progress Notes (Signed)
Virtual Visit via Telephone Note  I connected with Dawn Pope on 11/16/18 at  9:00 AM EDT by telephone and verified that I am speaking with the correct person using two identifiers.  Location: Patient: home Provider: [place] office   I discussed the limitations, risks, security and privacy concerns of performing an evaluation and management service by telephone and the availability of in person appointments. I also discussed with the patient that there may be a patient responsible charge related to this service. The patient expressed understanding and agreed to proceed.  I discussed the assessment and treatment plan with the patient. The patient was provided an opportunity to ask questions and all were answered. The patient agreed with the plan and demonstrated an understanding of the instructions.   The patient was advised to call back or seek an in-person evaluation if the symptoms worsen or if the condition fails to improve as anticipated.  I provided 15 minutes of non-face-to-face time during this encounter.   Izora Ribas, NP    FOLLOW UP  Assessment and Plan:   Pulmonary hypertension RVSP 45 (2010), CXR 2015 unremarkable- patient is asymptomatic, continue to monitor  Hypertension Doesn't have BP cuff, at goal last check in office, will schedule NV for VS and labs Emphasized need to monitor blood pressure at home; patient to call if consistently greater than 130/80 Continue DASH diet.   Reminder to go to the ER if any CP, SOB, nausea, dizziness, severe HA, changes vision/speech, left arm numbness and tingling and jaw pain. Check CMP/GFR  BMI 24 Recommended diet heavy in fruits and veggies and low in animal meats, cheeses, and dairy products, appropriate calorie intake Discussed ideal weight for height  Continue to monitor  Vitamin D Def continue supplementation; she has increased dose as recommended  Defer Vit D level to CPE   Anemia Check CBC; iron levels  were resolved to baseline at last visit   Schedule NV for VS and labs  Continue diet and meds as discussed. Further disposition pending results of labs. Discussed med's effects and SE's.   Over 30 minutes of exam, counseling, chart review, and critical decision making was performed.   Future Appointments  Date Time Provider [place]  11/18/2018  9:00 AM Liane Comber, NP [facility] None  05/22/2019  9:00 AM Liane Comber, NP [facility] None    ----------------------------------------------------------------------------------------------------------------------  HPI 48 y.o. female  presents for 6+ month follow up on hypertension, weight and vitamin D deficiency. Patient was noted to have pulmonary htn on ECHO 2010 (RVSP 45), CXR 2015 unremarkable- patient is asymptomatic.   BMI is There is no height or weight on file to calculate BMI., she has been working on diet, could be doing better, she does lift boxes at work and sweats a fair bit.  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 She denies chest pain, shortness of breath, dizziness.  \The cholesterol last visit was:   Lab Results  Component Value Date   CHOL 175 05/19/2018   HDL 62 05/19/2018   LDLCALC 99 05/19/2018  TRIG 46 05/19/2018   CHOLHDL 2.8 05/19/2018   Last A1C in the office was:  Lab Results  Component Value Date   HGBA1C 5.4 05/19/2018   Patient is on Vitamin D supplement, has increased dose to 10000 IU daily    Lab Results  Component Value Date   VD25OH 42 05/19/2018     She had recovering anemia at last check;  Lab Results  Component Value Date   WBC  6.8 05/19/2018   HGB 11.5 (L) 05/19/2018   HCT 34.5 (L) 05/19/2018   MCV 91.0 05/19/2018   PLT 251 05/19/2018    Iron was back within normal range:  Lab Results  Component Value Date   IRON 96 05/19/2018   TIBC 416 05/19/2018   FERRITIN 9 (L) 10/07/2015     Current Medications:  Current Outpatient Medications on File Prior to Visit  Medication Sig  . bisoprolol-hydrochlorothiazide (ZIAC) 5-6.25 MG tablet Start by taking 1 tab daily for BP goal <130/80. If still running above goal, can increase to 2 tabs daily in 2 weeks.  . Cholecalciferol (VITAMIN D-3) 5000 UNITS TABS Take 1 tablet by mouth every evening. Take 10,000 IU every other day   No current facility-administered medications on file prior to visit.      Allergies: No Known Allergies   Medical History:  Past Medical History:  Diagnosis Date  . Anemia   . Fibroids   . Hypertension   . Hypokalemia   . Hypomagnesemia   . Pulmonary hypertension ([place]) Via Echo 2010   EF 65% RVSP 45 (Dr. Stanford Breed)  . Vitamin D deficiency    Family history- Reviewed and unchanged Social history- Reviewed and unchanged   Review of Systems:  Review of Systems  Constitutional: Negative for malaise/fatigue and weight loss.  HENT: Negative for hearing loss and tinnitus.   Eyes: Negative for blurred vision and double vision.  Respiratory: Negative for cough, shortness of breath and wheezing.   Cardiovascular: Negative for chest pain, palpitations, orthopnea, claudication and leg swelling.  Gastrointestinal: Negative for abdominal pain, blood in stool, constipation, diarrhea, heartburn, melena, nausea and vomiting.  Genitourinary: Negative.   Musculoskeletal: Negative for joint pain and myalgias.  Skin: Negative for rash.  Neurological: Negative for dizziness, tingling, sensory change, weakness and headaches.  Endo/Heme/Allergies: Negative for polydipsia.  Psychiatric/Behavioral: Negative.   All other systems reviewed and are  negative.   Physical Exam: There were no vitals taken for this visit. Wt Readings from Last 3 Encounters:  05/19/18 149 lb 12.8 oz (67.9 kg)  01/19/18 143 lb (64.9 kg)  10/07/15 135 lb 9.6 oz (61.5 kg)   General : Well sounding patient in no apparent distress HEENT: no hoarseness, no cough for duration of visit Lungs: speaks in complete sentences, no audible wheezing, no apparent distress Neurological: alert, oriented x 3 Psychiatric: pleasant, judgement appropriate     Izora Ribas, NP 1:12 PM [facility]

## 2018-11-17 ENCOUNTER — Other Ambulatory Visit: Payer: Self-pay | Admitting: Adult Health

## 2018-11-18 ENCOUNTER — Other Ambulatory Visit: Payer: Self-pay | Admitting: Adult Health

## 2018-11-18 ENCOUNTER — Other Ambulatory Visit: Payer: Self-pay

## 2018-11-18 ENCOUNTER — Ambulatory Visit: Payer: 59 | Admitting: Adult Health

## 2018-11-18 ENCOUNTER — Encounter: Payer: Self-pay | Admitting: Adult Health

## 2018-11-18 DIAGNOSIS — I272 Pulmonary hypertension, unspecified: Secondary | ICD-10-CM | POA: Diagnosis not present

## 2018-11-18 DIAGNOSIS — I10 Essential (primary) hypertension: Secondary | ICD-10-CM

## 2018-11-18 DIAGNOSIS — D509 Iron deficiency anemia, unspecified: Secondary | ICD-10-CM

## 2018-11-18 DIAGNOSIS — E559 Vitamin D deficiency, unspecified: Secondary | ICD-10-CM

## 2018-11-28 ENCOUNTER — Ambulatory Visit: Payer: 59

## 2019-01-17 ENCOUNTER — Other Ambulatory Visit: Payer: Self-pay

## 2019-01-17 DIAGNOSIS — Z20822 Contact with and (suspected) exposure to covid-19: Secondary | ICD-10-CM

## 2019-01-18 LAB — NOVEL CORONAVIRUS, NAA: SARS-CoV-2, NAA: NOT DETECTED

## 2019-02-18 ENCOUNTER — Other Ambulatory Visit: Payer: Self-pay

## 2019-02-18 DIAGNOSIS — Z20822 Contact with and (suspected) exposure to covid-19: Secondary | ICD-10-CM

## 2019-02-19 LAB — NOVEL CORONAVIRUS, NAA: SARS-CoV-2, NAA: NOT DETECTED

## 2019-05-17 NOTE — Progress Notes (Signed)
Complete Physical  Assessment and Plan:  Dawn Pope was seen today for annual exam.  Diagnoses and all orders for this visit:  Encounter for routine adult health examination without abnormal findings Out of influenza vaccine in office - encouraged to get at pharmacy Schedule mammogram and GYN appointment for PAP if longer than 3-5 years Increase fluid intake   Pulmonary hypertension ([place]) Asymptomatic; continue to monitor; patient declines further workup/follow up  Essential hypertension Continue medication Monitor blood pressure at home; call if consistently over 130/80 Continue DASH diet.   Reminder to go to the ER if any CP, SOB, nausea, dizziness, severe HA, changes vision/speech, left arm numbness and tingling and jaw pain. -     CBC with Differential/Platelet -     COMPLETE METABOLIC PANEL WITH GFR -     Magnesium -     Urinalysis, Routine w reflex microscopic -     Microalbumin / creatinine urine ratio -     EKG 12-Lead  Uterine leiomyoma, unspecified location Follow up with GYN  Vitamin D deficiency -     VITAMIN D 25 Hydroxy (Vit-D Deficiency, Fractures)  Iron deficiency anemia, unspecified iron deficiency anemia type -     CBC with Differential/Platelet -     Iron,Total/Total Iron Binding Cap/Ferritin  Screening cholesterol level -     Lipid panel  Screening for thyroid disorder -     TSH  Screening for diabetes mellitus -     Hemoglobin A1c   Discussed med's effects and SE's. Screening labs and tests as requested with regular follow-up as recommended. Over 40 minutes of exam, counseling, chart review, and complex, high level critical decision making was performed this visit.   Future Appointments  Date Time Provider [place]  05/21/2020  9:00 AM Liane Comber, NP [facility] None     HPI  48 y.o. female  presents for a complete physical and follow up for has Hypertension; Vitamin D deficiency; Anemia; Fibroid, uterine; and Pulmonary  hypertension ([facility]) on their problem list..  She is single, 2 kids 37 and 21, no grand kids. Single and happy. 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   She has a known uterine fibroid, underwent uterine artery embolization in 2015, sx improved since then, continues to follow with GYN - [facility].   BMI is Body mass index is 26.33 kg/m., she has been working on diet, avoids red meat, pushes fruits/vegetable intake, drinks protein drinks twice weekly. 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 She admits to drinking only 1-2 bottles of water daily, knows she needs to do better. No sodas of caffeine.  Wt Readings from Last 3 Encounters:  05/22/19 151 lb (68.5 kg)  05/19/18 149 lb 12.8 oz (67.9 kg)  01/19/18 143 lb (64.9 kg)   Patient also has documented history of pulmonary hypertension apparently noted on an ECHO in 2010- patient is unaware of this diagnosis, but denies exertional dyspnea, lethargy, fatigue, snoring, AM headaches. She declines follow up on this.  Her blood pressure has been controlled at home, today their BP is BP: 136/80  She does not workout. She denies chest pain, shortness of breath, dizziness.   She is not on cholesterol medication and denies myalgias. Her cholesterol is at goal. The cholesterol last visit was:   Lab Results  Component Value Date   CHOL 175 05/19/2018   HDL 62 05/19/2018   LDLCALC 99 05/19/2018   TRIG 46 05/19/2018   CHOLHDL 2.8 05/19/2018   She has been  working on diet and exercise for glucose management, and denies increased appetite, nausea, paresthesia of the feet, polydipsia, polyuria and visual disturbances. Last A1C in the office was:  Lab Results  Component Value Date   HGBA1C 5.4 05/19/2018   Last GFR: Lab Results  Component Value Date   GFRAA 118 05/19/2018   Patient is on Vitamin D supplement, she takes 10000 daily.  Lab Results  Component Value Date   VD25OH 42 05/19/2018     She has mild persistent  anemia, hx of iron deficiency though iron/TIBC was normal last year:  She reports just gave blood last week and check prior was "good"  Lab Results  Component Value Date   WBC 6.8 05/19/2018   HGB 11.5 (L) 05/19/2018   HCT 34.5 (L) 05/19/2018   MCV 91.0 05/19/2018   PLT 251 05/19/2018   Lab Results  Component Value Date   IRON 96 05/19/2018   TIBC 416 05/19/2018   FERRITIN 9 (L) 10/07/2015     Current Medications:  Current Outpatient Medications on File Prior to Visit  Medication Sig Dispense Refill  . bisoprolol-hydrochlorothiazide (ZIAC) 5-6.25 MG tablet START BY TAKING 1 TAB DAILY FOR BP GOAL <130/80. IF STILL RUNNING ABOVE GOAL, CAN INCREASE TO 2 TABS DAILY IN 2 WEEKS. 90 tablet 1  . Cholecalciferol (VITAMIN D-3) 5000 UNITS TABS Take 2 tablets by mouth every evening. Take 10,000 IU daily    . Ferrous Sulfate (IRON PO) Take by mouth daily.    Marland Kitchen OVER THE COUNTER MEDICATION Takes Potassium, Magnesium, Calcium once daily     No current facility-administered medications on file prior to visit.   Allergies:  No Known Allergies Medical History:  She has Hypertension; Vitamin D deficiency; Anemia; Fibroid, uterine; and Pulmonary hypertension ([place]) on their problem list. Health Maintenance:   Immunization History  Administered Date(s) Administered  . Pneumococcal-Unspecified 06/14/2001  . Td 06/14/2001  . Tdap 06/15/2013   Tetanus: 2015 Pneumovax: 2003 Flu vaccine: declines Zostavax: N/A  Pap: Reports 1-2 years ago OB/GYN  MGM: will get at OB/GYN DEXA: N/A Colonoscopy: N/A EGD: N/A Echo: 2010 PHTN 66, EF 65%  Vision- unknown name, last remote Dentist- Dr. Arrie Aran q 6 months, last 2019, needs to schedule   Patient Care Team: Unk Pinto, MD as PCP - General (Internal Medicine)  Surgical History:  She has a past surgical history that includes Uterine artery embolization (04/27/2014) and ir generic historical (07/04/2014). Family History:  Herfamily history  includes Hyperlipidemia in her father and mother; Hypertension in her father and mother; Stroke (age of onset: 75) in her father. Social History:  She reports that she has never smoked. She has never used smokeless tobacco. She reports that she does not drink alcohol or use drugs.  Review of Systems: Review of Systems  Constitutional: Negative for malaise/fatigue and weight loss.  HENT: Negative for hearing loss and tinnitus.   Eyes: Negative for blurred vision and double vision.  Respiratory: Negative for cough, shortness of breath and wheezing.   Cardiovascular: Negative for chest pain, palpitations, orthopnea, claudication and leg swelling.  Gastrointestinal: Negative for abdominal pain, blood in stool, constipation, diarrhea, heartburn, melena, nausea and vomiting.  Genitourinary: Negative.   Musculoskeletal: Negative for joint pain and myalgias.  Skin: Negative for rash.  Neurological: Negative for dizziness, tingling, sensory change, weakness and headaches.  Endo/Heme/Allergies: Negative for polydipsia.  Psychiatric/Behavioral: Negative.   All other systems reviewed and are negative.   Physical Exam: Estimated body mass index is 26.33  kg/m as calculated from the following:   Height as of this encounter: 5' 3.5" (1.613 m).   Weight as of this encounter: 151 lb (68.5 kg). BP 136/80   Pulse 86   Temp (!) 97.5 F (36.4 C)   Ht 5' 3.5" (1.613 m)   Wt 151 lb (68.5 kg)   SpO2 99%   BMI 26.33 kg/m  General Appearance: Well nourished, in no apparent distress.  Eyes: PERRLA, EOMs, conjunctiva no swelling or erythema Sinuses: No Frontal/maxillary tenderness  ENT/Mouth: Ext aud canals clear, normal light reflex with TMs without erythema, bulging. Good dentition. No erythema, swelling, or exudate on post pharynx. Tonsils not swollen or erythematous. Hearing normal.  Neck: Supple, thyroid normal. No bruits  Respiratory: Respiratory effort normal, BS equal bilaterally without rales,  rhonchi, wheezing or stridor.  Cardio: RRR without murmurs, rubs or gallops. Brisk peripheral pulses without edema.  Chest: symmetric, with normal excursions and percussion.  Breasts: Defer to GYN Abdomen: Soft, nontender, no guarding, rebound, hernias, masses, or organomegaly.  Lymphatics: Non tender without lymphadenopathy.  Genitourinary: Defer to GYN Musculoskeletal: Full ROM all peripheral extremities,5/5 strength, and normal gait.  Skin: Warm, dry without rashes, lesions, ecchymosis. Neuro: Cranial nerves intact, reflexes equal bilaterally. Normal muscle tone, no cerebellar symptoms. Sensation intact.  Psych: Awake and oriented X 3, normal affect, Insight and Judgment appropriate.   EKG: WNL no ST changes.   Izora Ribas 9:17 AM [facility]

## 2019-05-22 ENCOUNTER — Encounter: Payer: Self-pay | Admitting: Adult Health

## 2019-05-22 ENCOUNTER — Other Ambulatory Visit: Payer: Self-pay

## 2019-05-22 ENCOUNTER — Other Ambulatory Visit: Payer: Self-pay | Admitting: Adult Health

## 2019-05-22 ENCOUNTER — Ambulatory Visit (INDEPENDENT_AMBULATORY_CARE_PROVIDER_SITE_OTHER): Payer: 59 | Admitting: Adult Health

## 2019-05-22 VITALS — BP 136/80 | HR 86 | Temp 97.5°F | Ht 63.5 in | Wt 151.0 lb

## 2019-05-22 DIAGNOSIS — Z136 Encounter for screening for cardiovascular disorders: Secondary | ICD-10-CM

## 2019-05-22 DIAGNOSIS — Z131 Encounter for screening for diabetes mellitus: Secondary | ICD-10-CM

## 2019-05-22 DIAGNOSIS — Z13220 Encounter for screening for lipoid disorders: Secondary | ICD-10-CM

## 2019-05-22 DIAGNOSIS — I10 Essential (primary) hypertension: Secondary | ICD-10-CM | POA: Diagnosis not present

## 2019-05-22 DIAGNOSIS — Z0000 Encounter for general adult medical examination without abnormal findings: Secondary | ICD-10-CM | POA: Diagnosis not present

## 2019-05-22 DIAGNOSIS — I272 Pulmonary hypertension, unspecified: Secondary | ICD-10-CM

## 2019-05-22 DIAGNOSIS — E559 Vitamin D deficiency, unspecified: Secondary | ICD-10-CM

## 2019-05-22 DIAGNOSIS — D259 Leiomyoma of uterus, unspecified: Secondary | ICD-10-CM

## 2019-05-22 DIAGNOSIS — D509 Iron deficiency anemia, unspecified: Secondary | ICD-10-CM

## 2019-05-22 NOTE — Patient Instructions (Addendum)
  Ms. Sulaiman , Thank you for taking time to come for your Medicare Wellness Visit. I appreciate your ongoing commitment to your health goals. Please review the following plan we discussed and let me know if I can assist you in the future.   These are the goals we discussed: Goals    . Blood Pressure < 130/80    . DIET - INCREASE WATER INTAKE    . Weight (lb) < 140 lb (63.5 kg)       This is a list of the screening recommended for you and due dates:  Health Maintenance  Topic Date Due  . HIV Screening  03/05/1986  . Pap Smear  06/14/2013  . Mammogram  03/22/2015  . Flu Shot  12/31/2018  . Tetanus Vaccine  06/16/2023    Please get flu vaccine at a pharmacy   Please get your mammogram before the end of the year if possible   Please call GYN and verify when last PAP was - need to schedule if longer than 3-5 years  HOW TO SCHEDULE A MAMMOGRAM  [facility]  7 a.m.-6:30 p.m., Monday 7 a.m.-5 p.m., Tuesday-Friday Schedule an appointment by calling [phone].  [facility] Schedule an appointment by calling [phone].      Know what a healthy weight is for you (roughly BMI <25) and aim to maintain this  Aim for 7+ servings of fruits and vegetables daily  65-80+ fluid ounces of water or unsweet tea for healthy kidneys  Limit to max 1 drink of alcohol per day; avoid smoking/tobacco  Limit animal fats in diet for cholesterol and heart health - choose grass fed whenever available  Avoid highly processed foods, and foods high in saturated/trans fats  Aim for low stress - take time to unwind and care for your mental health  Aim for 150 min of moderate intensity exercise weekly for heart health, and weights twice weekly for bone health  Aim for 7-9 hours of sleep daily       When it comes to diets, agreement about the perfect plan isn't easy to find, even among the experts. Experts at [employer] developed  an idea known as the Healthy Eating Plate. Just imagine a plate divided into logical, healthy portions.  The emphasis is on diet quality:  Load up on vegetables and fruits - one-half of your plate: Aim for color and variety, and remember that potatoes don't count.  Go for whole grains - one-quarter of your plate: Whole wheat, barley, wheat berries, quinoa, oats, brown rice, and foods made with them. If you want pasta, go with whole wheat pasta.  Protein power - one-quarter of your plate: Fish, chicken, beans, and nuts are all healthy, versatile protein sources. Limit red meat.  The diet, however, does go beyond the plate, offering a few other suggestions.  Use healthy plant oils, such as olive, canola, soy, corn, sunflower and peanut. Check the labels, and avoid partially hydrogenated oil, which have unhealthy trans fats.  If you're thirsty, drink water. Coffee and tea are good in moderation, but skip sugary drinks and limit milk and dairy products to one or two daily servings.  The type of carbohydrate in the diet is more important than the amount. Some sources of carbohydrates, such as vegetables, fruits, whole grains, and beans--are healthier than others.  Finally, stay active.

## 2019-05-23 LAB — VITAMIN D 25 HYDROXY (VIT D DEFICIENCY, FRACTURES): Vit D, 25-Hydroxy: 54 ng/mL (ref 30–100)

## 2019-05-23 LAB — TSH: TSH: 3.12 mIU/L

## 2019-05-23 LAB — COMPLETE METABOLIC PANEL WITH GFR
AG Ratio: 1.6 (calc) (ref 1.0–2.5)
ALT: 11 U/L (ref 6–29)
AST: 17 U/L (ref 10–35)
Albumin: 4.4 g/dL (ref 3.6–5.1)
Alkaline phosphatase (APISO): 66 U/L (ref 31–125)
BUN: 14 mg/dL (ref 7–25)
CO2: 27 mmol/L (ref 20–32)
Calcium: 9.7 mg/dL (ref 8.6–10.2)
Chloride: 102 mmol/L (ref 98–110)
Creat: 0.74 mg/dL (ref 0.50–1.10)
GFR, Est African American: 111 mL/min/{1.73_m2} (ref >=60)
GFR, Est Non African American: 96 mL/min/{1.73_m2} (ref >=60)
Globulin: 2.7 g/dL (calc) (ref 1.9–3.7)
Glucose, Bld: 92 mg/dL (ref 65–99)
Potassium: 3.8 mmol/L (ref 3.5–5.3)
Sodium: 138 mmol/L (ref 135–146)
Total Bilirubin: 0.4 mg/dL (ref 0.2–1.2)
Total Protein: 7.1 g/dL (ref 6.1–8.1)

## 2019-05-23 LAB — CBC WITH DIFFERENTIAL/PLATELET
Absolute Monocytes: 490 cells/uL (ref 200–950)
Basophils Absolute: 11 cells/uL (ref 0–200)
Basophils Relative: 0.2 %
Eosinophils Absolute: 88 cells/uL (ref 15–500)
Eosinophils Relative: 1.6 %
HCT: 33.1 % — ABNORMAL LOW (ref 35.0–45.0)
Hemoglobin: 11.2 g/dL — ABNORMAL LOW (ref 11.7–15.5)
Lymphs Abs: 1667 cells/uL (ref 850–3900)
MCH: 30.9 pg (ref 27.0–33.0)
MCHC: 33.8 g/dL (ref 32.0–36.0)
MCV: 91.4 fL (ref 80.0–100.0)
MPV: 10.9 fL (ref 7.5–12.5)
Monocytes Relative: 8.9 %
Neutro Abs: 3245 cells/uL (ref 1500–7800)
Neutrophils Relative %: 59 %
Platelets: 248 10*3/uL (ref 140–400)
RBC: 3.62 10*6/uL — ABNORMAL LOW (ref 3.80–5.10)
RDW: 11.9 % (ref 11.0–15.0)
Total Lymphocyte: 30.3 %
WBC: 5.5 10*3/uL (ref 3.8–10.8)

## 2019-05-23 LAB — LIPID PANEL
Cholesterol: 183 mg/dL (ref <200)
HDL: 58 mg/dL (ref >=50)
LDL Cholesterol (Calc): 111 mg/dL (calc) — ABNORMAL HIGH
Non-HDL Cholesterol (Calc): 125 mg/dL (calc) (ref <130)
Total CHOL/HDL Ratio: 3.2 (calc) (ref <5.0)
Triglycerides: 62 mg/dL (ref <150)

## 2019-05-23 LAB — URINALYSIS, ROUTINE W REFLEX MICROSCOPIC
Bilirubin Urine: NEGATIVE
Glucose, UA: NEGATIVE
Hgb urine dipstick: NEGATIVE
Ketones, ur: NEGATIVE
Leukocytes,Ua: NEGATIVE
Nitrite: NEGATIVE
Protein, ur: NEGATIVE
Specific Gravity, Urine: 1.011 (ref 1.001–1.03)
pH: 5.5 (ref 5.0–8.0)

## 2019-05-23 LAB — IRON,TIBC AND FERRITIN PANEL
%SAT: 13 % (calc) — ABNORMAL LOW (ref 16–45)
Ferritin: 25 ng/mL (ref 16–232)
Iron: 48 ug/dL (ref 40–190)
TIBC: 375 mcg/dL (calc) (ref 250–450)

## 2019-05-23 LAB — HEMOGLOBIN A1C
Hgb A1c MFr Bld: 5.2 % of total Hgb (ref <5.7)
Mean Plasma Glucose: 103 (calc)
eAG (mmol/L): 5.7 (calc)

## 2019-05-23 LAB — MAGNESIUM: Magnesium: 1.9 mg/dL (ref 1.5–2.5)

## 2019-05-23 LAB — MICROALBUMIN / CREATININE URINE RATIO
Creatinine, Urine: 63 mg/dL (ref 20–275)
Microalb Creat Ratio: 8 mcg/mg creat (ref <30)
Microalb, Ur: 0.5 mg/dL

## 2019-11-19 ENCOUNTER — Other Ambulatory Visit: Payer: Self-pay | Admitting: Adult Health

## 2019-11-20 ENCOUNTER — Ambulatory Visit: Payer: 59 | Admitting: Adult Health

## 2019-11-30 NOTE — Progress Notes (Signed)
FOLLOW UP  Assessment and Plan:   Pulmonary hypertension RVSP 45 (2010), CXR 2015 unremarkable- patient is asymptomatic, continue to monitor Declines cardiology or ECHO follow up due to cost   Hypertension Borderline/fair control with current medication Emphasized need to monitor blood pressure at home; patient to call if consistently greater than 130/80 Continue DASH diet.   Reminder to go to the ER if any CP, SOB, nausea, dizziness, severe HA, changes vision/speech, left arm numbness and tingling and jaw pain. Check CMP/GFR  BMI 26 Recommended diet heavy in fruits and veggies and low in animal meats, cheeses, and dairy products, appropriate calorie intake Discussed ideal weight for height  Continue to monitor  Vitamin D Def continue supplementation; she has increased dose as recommended  Defer Vit D level to CPE   Anemia Check CBC; iron levels were resolved to baseline at last visit  Check B12 after discussion with patient   Continue diet and meds as discussed. Further disposition pending results of labs. Discussed med's effects and SE's.   Over 30 minutes of exam, counseling, chart review, and critical decision making was performed.   Future Appointments  Date Time Provider [place]  05/21/2020  9:00 AM Liane Comber, NP [facility] None    ----------------------------------------------------------------------------------------------------------------------  HPI 49 y.o. female  presents for 6+ month follow up on hypertension, weight and vitamin D deficiency.   BMI is Body mass index is 27.51 kg/m., she has been working on diet, could be doing better, some junk food working 3rd shift, she does lift boxes at work and sweats a fair bit at work, plans to do a 5k in 2 months.   Wt Readings from Last 3 Encounters:  12/05/19 157 lb 12.8 oz (71.6 kg)  05/22/19 151 lb (68.5 kg)  05/19/18 149 lb 12.8 oz (67.9 kg)   Patient was noted to have pulmonary htn on ECHO  2010 (RVSP 45), CXR 2015 unremarkable- patient is asymptomatic. She has declined cardiology or ECHO for follow up due to cost/financial limitations.  She admits she has not been checking BPs, has cuff from friend but hasn't pulled out, today their BP is BP: 130/82 She is currently on ziac due to electrolyte changes and cramps with ACE/ARB and HCTZ  She does workout. She denies chest pain, shortness of breath, dizziness.  The cholesterol last visit was:   Lab Results  Component Value Date   CHOL 183 05/22/2019   HDL 58 05/22/2019   LDLCALC 111 (H) 05/22/2019   TRIG 62 05/22/2019   CHOLHDL 3.2 05/22/2019   Last A1C in the office was:  Lab Results  Component Value Date   HGBA1C 5.2 05/22/2019   Patient is on Vitamin D supplement, taking 10000 IU daily    Lab Results  Component Value Date   VD25OH 54 05/22/2019      She has persistent normocytic anemia, hx of iron def CBC Latest Ref Rng & Units 05/22/2019 05/19/2018 01/19/2018  WBC 3.8 - 10.8 Thousand/uL 5.5 6.8 4.7  Hemoglobin 11.7 - 15.5 g/dL 11.2(L) 11.5(L) 10.5(L)  Hematocrit 35 - 45 % 33.1(L) 34.5(L) 32.6(L)  Platelets 140 - 400 Thousand/uL 248 251 275    Iron was back within normal range:  Lab Results  Component Value Date   IRON 48 05/22/2019   TIBC 375 05/22/2019   FERRITIN 25 05/22/2019     Current Medications:  Current Outpatient Medications on File Prior to Visit  Medication Sig  . bisoprolol-hydrochlorothiazide (ZIAC) 5-6.25 MG tablet Take 1  tablet Daily for BP  . Cholecalciferol (VITAMIN D-3) 5000 UNITS TABS Take 2 tablets by mouth every evening. Take 10,000 IU daily  . Ferrous Sulfate (IRON PO) Take by mouth daily.  Marland Kitchen OVER THE COUNTER MEDICATION Takes Potassium, Magnesium, Calcium once daily   No current facility-administered medications on file prior to visit.     Allergies: No Known Allergies   Medical History:  Past Medical History:  Diagnosis Date  . Anemia   . Fibroids   . Hypertension   .  Hypokalemia   . Hypomagnesemia   . Pulmonary hypertension ([place]) Via Echo 2010   EF 65% RVSP 45 (Dr. Stanford Breed)  . Vitamin D deficiency    Family history- Reviewed and unchanged Social history- Reviewed and unchanged   Review of Systems:  Review of Systems  Constitutional: Negative for malaise/fatigue and weight loss.  HENT: Negative for hearing loss and tinnitus.   Eyes: Negative for blurred vision and double vision.  Respiratory: Negative for cough, shortness of breath and wheezing.   Cardiovascular: Negative for chest pain, palpitations, orthopnea, claudication and leg swelling.  Gastrointestinal: Negative for abdominal pain, blood in stool, constipation, diarrhea, heartburn, melena, nausea and vomiting.  Genitourinary: Negative.   Musculoskeletal: Negative for joint pain and myalgias.  Skin: Negative for rash.  Neurological: Negative for dizziness, tingling, sensory change, weakness and headaches.  Endo/Heme/Allergies: Negative for polydipsia.  Psychiatric/Behavioral: Negative.   All other systems reviewed and are negative.   Physical Exam: BP 130/82   Pulse 70   Temp (!) 97.3 F (36.3 C)   Ht 5' 3.5" (1.613 m)   Wt 157 lb 12.8 oz (71.6 kg)   SpO2 99%   BMI 27.51 kg/m  Wt Readings from Last 3 Encounters:  12/05/19 157 lb 12.8 oz (71.6 kg)  05/22/19 151 lb (68.5 kg)  05/19/18 149 lb 12.8 oz (67.9 kg)   General Appearance: Well nourished, in no apparent distress. Eyes: PERRLA, EOMs, conjunctiva no swelling or erythema Sinuses: No Frontal/maxillary tenderness ENT/Mouth: Ext aud canals clear, TMs without erythema, bulging. No erythema, swelling, or exudate on post pharynx.  Tonsils not swollen or erythematous. Hearing normal.  Neck: Supple, thyroid normal.  Respiratory: Respiratory effort normal, BS equal bilaterally without rales, rhonchi, wheezing or stridor.  Cardio: RRR with no MRGs. Brisk peripheral pulses without edema.  Abdomen: Soft, + BS.  Non tender, no  guarding, rebound, hernias, masses. Lymphatics: Non tender without lymphadenopathy.  Musculoskeletal: Full ROM, 5/5 strength, Normal gait Skin: Warm, dry without rashes, lesions, ecchymosis.  Neuro: Cranial nerves intact. No cerebellar symptoms.  Psych: Awake and oriented X 3, normal affect, Insight and Judgment appropriate.     Izora Ribas, NP 9:43 AM [facility]

## 2019-12-05 ENCOUNTER — Encounter: Payer: Self-pay | Admitting: Adult Health

## 2019-12-05 ENCOUNTER — Other Ambulatory Visit: Payer: Self-pay

## 2019-12-05 ENCOUNTER — Ambulatory Visit (INDEPENDENT_AMBULATORY_CARE_PROVIDER_SITE_OTHER): Payer: 59 | Admitting: Adult Health

## 2019-12-05 VITALS — BP 130/82 | HR 70 | Temp 97.3°F | Ht 63.5 in | Wt 157.8 lb

## 2019-12-05 DIAGNOSIS — Z6826 Body mass index (BMI) 26.0-26.9, adult: Secondary | ICD-10-CM | POA: Diagnosis not present

## 2019-12-05 DIAGNOSIS — U071 COVID-19: Secondary | ICD-10-CM

## 2019-12-05 DIAGNOSIS — I10 Essential (primary) hypertension: Secondary | ICD-10-CM | POA: Diagnosis not present

## 2019-12-05 DIAGNOSIS — D509 Iron deficiency anemia, unspecified: Secondary | ICD-10-CM

## 2019-12-05 DIAGNOSIS — I272 Pulmonary hypertension, unspecified: Secondary | ICD-10-CM

## 2019-12-05 DIAGNOSIS — E559 Vitamin D deficiency, unspecified: Secondary | ICD-10-CM | POA: Diagnosis not present

## 2019-12-05 DIAGNOSIS — Z130 Encounter for screening for diseases of the blood and blood-forming organs and certain disorders involving the immune mechanism: Secondary | ICD-10-CM

## 2019-12-05 DIAGNOSIS — D649 Anemia, unspecified: Secondary | ICD-10-CM

## 2019-12-05 DIAGNOSIS — E785 Hyperlipidemia, unspecified: Secondary | ICD-10-CM

## 2019-12-05 HISTORY — DX: COVID-19: U07.1

## 2019-12-05 NOTE — Patient Instructions (Addendum)
Goals    . Blood Pressure < 130/80    . DIET - INCREASE WATER INTAKE    . Weight (lb) < 140 lb (63.5 kg)       Recommend checking weight once per week  Check blood pressure occasionally     High-Fiber Diet Fiber, also called dietary fiber, is a type of carbohydrate that is found in fruits, vegetables, whole grains, and beans. A high-fiber diet can have many health benefits. Your health care provider may recommend a high-fiber diet to help:  Prevent constipation. Fiber can make your bowel movements more regular.  Lower your cholesterol.  Relieve the following conditions: ? Swelling of veins in the anus (hemorrhoids). ? Swelling and irritation (inflammation) of specific areas of the digestive tract (uncomplicated diverticulosis). ? A problem of the large intestine (colon) that sometimes causes pain and diarrhea (irritable bowel syndrome, IBS).  Prevent overeating as part of a weight-loss plan.  Prevent heart disease, type 2 diabetes, and certain cancers. What is my plan? The recommended daily fiber intake in grams (g) includes:  38 g for men age 49 or younger.  30 g for men over age 24.  60 g for women age 49 or younger.  21 g for women over age 49. You can get the recommended daily intake of dietary fiber by:  Eating a variety of fruits, vegetables, grains, and beans.  Taking a fiber supplement, if it is not possible to get enough fiber through your diet. What do I need to know about a high-fiber diet?  It is better to get fiber through food sources rather than from fiber supplements. There is not a lot of research about how effective supplements are.  Always check the fiber content on the nutrition facts label of any prepackaged food. Look for foods that contain 5 g of fiber or more per serving.  Talk with a diet and nutrition specialist (dietitian) if you have questions about specific foods that are recommended or not recommended for your medical condition,  especially if those foods are not listed below.  Gradually increase how much fiber you consume. If you increase your intake of dietary fiber too quickly, you may have bloating, cramping, or gas.  Drink plenty of water. Water helps you to digest fiber. What are tips for following this plan?  Eat a wide variety of high-fiber foods.  Make sure that half of the grains that you eat each day are whole grains.  Eat breads and cereals that are made with whole-grain flour instead of refined flour or white flour.  Eat brown rice, bulgur wheat, or millet instead of white rice.  Start the day with a breakfast that is high in fiber, such as a cereal that contains 5 g of fiber or more per serving.  Use beans in place of meat in soups, salads, and pasta dishes.  Eat high-fiber snacks, such as berries, raw vegetables, nuts, and popcorn.  Choose whole fruits and vegetables instead of processed forms like juice or sauce. What foods can I eat?  Fruits Berries. Pears. Apples. Oranges. Avocado. Prunes and raisins. Dried figs. Vegetables Sweet potatoes. Spinach. Kale. Artichokes. Cabbage. Broccoli. Cauliflower. Green peas. Carrots. Squash. Grains Whole-grain breads. Multigrain cereal. Oats and oatmeal. Brown rice. Barley. Bulgur wheat. Ruch. Quinoa. Bran muffins. Popcorn. Rye wafer crackers. Meats and other proteins Navy, kidney, and pinto beans. Soybeans. Split peas. Lentils. Nuts and seeds. Dairy Fiber-fortified yogurt. Beverages Fiber-fortified soy milk. Fiber-fortified orange juice. Other foods Fiber bars. The items  listed above may not be a complete list of recommended foods and beverages. Contact a dietitian for more options. What foods are not recommended? Fruits Fruit juice. Cooked, strained fruit. Vegetables Fried potatoes. Canned vegetables. Well-cooked vegetables. Grains White bread. Pasta made with refined flour. White rice. Meats and other proteins Fatty cuts of meat. Fried  chicken or fried fish. Dairy Milk. Yogurt. Cream cheese. Sour cream. Fats and oils Butters. Beverages Soft drinks. Other foods Cakes and pastries. The items listed above may not be a complete list of foods and beverages to avoid. Contact a dietitian for more information. Summary  Fiber is a type of carbohydrate. It is found in fruits, vegetables, whole grains, and beans.  There are many health benefits of eating a high-fiber diet, such as preventing constipation, lowering blood cholesterol, helping with weight loss, and reducing your risk of heart disease, diabetes, and certain cancers.  Gradually increase your intake of fiber. Increasing too fast can result in cramping, bloating, and gas. Drink plenty of water while you increase your fiber.  The best sources of fiber include whole fruits and vegetables, whole grains, nuts, seeds, and beans. This information is not intended to replace advice given to you by your health care provider. Make sure you discuss any questions you have with your health care provider. Document Revised: 03/22/2017 Document Reviewed: 03/22/2017 Elsevier Patient Education  2020 [employer].

## 2019-12-06 LAB — COMPLETE METABOLIC PANEL WITH GFR
AG Ratio: 1.7 (calc) (ref 1.0–2.5)
ALT: 15 U/L (ref 6–29)
AST: 15 U/L (ref 10–35)
Albumin: 4.3 g/dL (ref 3.6–5.1)
Alkaline phosphatase (APISO): 73 U/L (ref 31–125)
BUN: 15 mg/dL (ref 7–25)
CO2: 28 mmol/L (ref 20–32)
Calcium: 9.6 mg/dL (ref 8.6–10.2)
Chloride: 103 mmol/L (ref 98–110)
Creat: 0.66 mg/dL (ref 0.50–1.10)
GFR, Est African American: 121 mL/min/{1.73_m2} (ref >=60)
GFR, Est Non African American: 104 mL/min/{1.73_m2} (ref >=60)
Globulin: 2.6 g/dL (calc) (ref 1.9–3.7)
Glucose, Bld: 93 mg/dL (ref 65–99)
Potassium: 3.9 mmol/L (ref 3.5–5.3)
Sodium: 137 mmol/L (ref 135–146)
Total Bilirubin: 0.5 mg/dL (ref 0.2–1.2)
Total Protein: 6.9 g/dL (ref 6.1–8.1)

## 2019-12-06 LAB — LIPID PANEL
Cholesterol: 182 mg/dL (ref <200)
HDL: 52 mg/dL (ref >=50)
LDL Cholesterol (Calc): 108 mg/dL (calc) — ABNORMAL HIGH
Non-HDL Cholesterol (Calc): 130 mg/dL (calc) — ABNORMAL HIGH (ref <130)
Total CHOL/HDL Ratio: 3.5 (calc) (ref <5.0)
Triglycerides: 108 mg/dL (ref <150)

## 2019-12-06 LAB — CBC WITH DIFFERENTIAL/PLATELET
Absolute Monocytes: 551 cells/uL (ref 200–950)
Basophils Absolute: 12 cells/uL (ref 0–200)
Basophils Relative: 0.2 %
Eosinophils Absolute: 122 cells/uL (ref 15–500)
Eosinophils Relative: 2.1 %
HCT: 37.6 % (ref 35.0–45.0)
Hemoglobin: 12.3 g/dL (ref 11.7–15.5)
Lymphs Abs: 1769 cells/uL (ref 850–3900)
MCH: 30.7 pg (ref 27.0–33.0)
MCHC: 32.7 g/dL (ref 32.0–36.0)
MCV: 93.8 fL (ref 80.0–100.0)
MPV: 11.4 fL (ref 7.5–12.5)
Monocytes Relative: 9.5 %
Neutro Abs: 3347 cells/uL (ref 1500–7800)
Neutrophils Relative %: 57.7 %
Platelets: 216 10*3/uL (ref 140–400)
RBC: 4.01 10*6/uL (ref 3.80–5.10)
RDW: 12 % (ref 11.0–15.0)
Total Lymphocyte: 30.5 %
WBC: 5.8 10*3/uL (ref 3.8–10.8)

## 2019-12-06 LAB — MAGNESIUM: Magnesium: 1.8 mg/dL (ref 1.5–2.5)

## 2019-12-06 LAB — VITAMIN B12: Vitamin B-12: 611 pg/mL (ref 200–1100)

## 2019-12-06 LAB — TSH: TSH: 3.96 mIU/L

## 2020-02-13 ENCOUNTER — Other Ambulatory Visit: Payer: Self-pay | Admitting: Internal Medicine

## 2020-05-16 ENCOUNTER — Other Ambulatory Visit: Payer: Self-pay | Admitting: Internal Medicine

## 2020-05-20 ENCOUNTER — Encounter: Payer: Self-pay | Admitting: Adult Health

## 2020-05-20 DIAGNOSIS — E663 Overweight: Secondary | ICD-10-CM | POA: Insufficient documentation

## 2020-05-20 NOTE — Progress Notes (Signed)
Complete Physical  Assessment and Plan:  Annalysse was seen today for annual exam.  Diagnoses and all orders for this visit:  Encounter for routine adult health examination without abnormal findings Out of influenza vaccine in office - encouraged to get at pharmacy Schedule GYN appointment for PAP  Increase fluid intake   Pulmonary hypertension ([place]) Asymptomatic; continue to monitor; patient declines further workup/follow up - CXR  Essential hypertension Continue medication Monitor blood pressure at home; call if consistently over 130/80 Continue DASH diet.   Reminder to go to the ER if any CP, SOB, nausea, dizziness, severe HA, changes vision/speech, left arm numbness and tingling and jaw pain. -     CBC with Differential/Platelet -     COMPLETE METABOLIC PANEL WITH GFR -     Magnesium -     Urinalysis, Routine w reflex microscopic -     Microalbumin / creatinine urine ratio -     EKG 12-Lead  Uterine leiomyoma, unspecified location Follow up with GYN  Vitamin D deficiency -     VITAMIN D 25 Hydroxy (Vit-D Deficiency, Fractures)  Iron deficiency anemia, unspecified iron deficiency anemia type -     CBC with Differential/Platelet -     Iron,Total/Total Iron Binding Cap/Ferritin  Hyperlipidemia Mild elevations treated by lifestyle  Continue low cholesterol diet and exercise.  Check lipid panel.  -     Lipid panel -     TSH  Screening for thyroid disorder -     TSH  Screening for diabetes mellitus -     Hemoglobin A1c   Screening for colon cancer -     Ambulatory referral to Gastroenterology  Thyroid nodule -     US THYROID; Future  Encounter for screening mammogram for malignant neoplasm of breast -     MM Digital Screening; Future   Discussed med's effects and SE's. Screening labs and tests as requested with regular follow-up as recommended. Over 40 minutes of exam, counseling, chart review, and complex, high level critical decision making was performed  this visit.   Future Appointments  Date Time Provider [place]  11/20/2020  8:45 AM Liane Comber, NP [facility] None  05/21/2021  9:00 AM Liane Comber, NP [facility] None     HPI  49 y.o. female  presents for a complete physical and follow up for has Hypertension; Vitamin D deficiency; Fibroid, uterine; Pulmonary hypertension ([place]); Overweight (BMI 25.0-29.9); and Thyroid nodule on their problem list..  She is single, 2 kids 73 and 2, no grand kids. Single and happy. 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 Does a lot of volunteering.   She has a known uterine fibroid, underwent uterine artery embolization in 2015, sx improved since then, continues to follow with GYN - [facility].   BMI is Body mass index is 27.31 kg/m., she has been working on diet, avoids red meat, pushes fruits/vegetable intake, drinks protein drinks twice weekly. 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 She has increased to 2 bottles of water daily, knows she needs to do better. Rare sodas of caffeine. No alcohol.  Wt Readings from Last 3 Encounters:  05/21/20 156 lb 9.6 oz (71 kg)  12/05/19 157 lb 12.8 oz (71.6 kg)  05/22/19 151 lb (68.5 kg)   Patient also has documented history of pulmonary hypertension apparently noted on an ECHO in 2010- patient is unaware of this diagnosis, but denies exertional dyspnea, lethargy, fatigue, snoring, AM headaches. She declines follow up on this.   Her  blood pressure has been controlled at home, today their BP is BP: 118/78  She does not workout but works physically intense job. She denies chest pain, shortness of breath, dizziness.   She is not on cholesterol medication and denies myalgias. Her cholesterol is not at goal. The cholesterol last visit was:   Lab Results  Component Value Date   CHOL 182 12/05/2019   HDL 52 12/05/2019   LDLCALC 108 (H) 12/05/2019   TRIG 108 12/05/2019   CHOLHDL 3.5 12/05/2019   She has been working on  diet and exercise for glucose management, and denies increased appetite, nausea, paresthesia of the feet, polydipsia, polyuria and visual disturbances. Last A1C in the office was:  Lab Results  Component Value Date   HGBA1C 5.2 05/22/2019   Last GFR: Lab Results  Component Value Date   GFRAA 121 12/05/2019   Patient is on Vitamin D supplement, she takes 10000 daily.  Lab Results  Component Value Date   VD25OH 82 05/22/2019     She has menstrual cycles, on currently, has been on iron supplement in the last year, taking every other day only, reports gave blood last week Lab Results  Component Value Date   IRON 48 05/22/2019   TIBC 375 05/22/2019   FERRITIN 25 05/22/2019     Current Medications:  Current Outpatient Medications on File Prior to Visit  Medication Sig Dispense Refill  . B Complex Vitamins (B COMPLEX PO) Take by mouth daily.    . bisoprolol-hydrochlorothiazide (ZIAC) 5-6.25 MG tablet TAKE 1 TABLET DAILY FOR BLOOD PRESSURE 90 tablet 1  . Cholecalciferol (VITAMIN D-3) 5000 UNITS TABS Take 2 tablets by mouth every evening. Take 10,000 IU daily    . Ferrous Sulfate (IRON PO) Take by mouth daily.    Marland Kitchen OVER THE COUNTER MEDICATION Takes Potassium, Magnesium, Calcium once daily     No current facility-administered medications on file prior to visit.   Allergies:  No Known Allergies Medical History:  She has Hypertension; Vitamin D deficiency; Fibroid, uterine; Pulmonary hypertension ([place]); Overweight (BMI 25.0-29.9); and Thyroid nodule on their problem list. Health Maintenance:   Immunization History  Administered Date(s) Administered  . Janssen (J&J) SARS-COV-2 Vaccination 09/01/2019  . Pneumococcal-Unspecified 06/14/2001  . Td 06/14/2001  . Tdap 06/15/2013   Tetanus: 2015 Pneumovax: 2003 Flu vaccine: declines Zostavax: N/A covid 19: J&J 2021, will get booster   Pap: Reports ? 2018 OB/GYN, will call to schedule MGM: was getting at GYN, but not going annually -  given phone number to schedule with breast center  DEXA: N/A  Colonoscopy: referral for colonoscopy placed  EGD: N/A Echo: 2010 PHTN 45, EF 65%  Vision- unknown name, last remote, recommended to schedule Dentist- Dr. Arrie Aran, last 2020, needs to schedule   Patient Care Team: Unk Pinto, MD as PCP - General (Internal Medicine)  Surgical History:  She has a past surgical history that includes Uterine artery embolization (04/27/2014) and ir generic historical (07/04/2014). Family History:  Herfamily history includes Heart disease in her paternal grandmother; Hyperlipidemia in her father and mother; Hypertension in her father and mother; Stroke (age of onset: 61) in her father. Social History:  She reports that she has never smoked. She has never used smokeless tobacco. She reports that she does not drink alcohol and does not use drugs.  Review of Systems: Review of Systems  Constitutional: Negative for malaise/fatigue and weight loss.  HENT: Negative for hearing loss and tinnitus.   Eyes: Negative for blurred vision  and double vision.  Respiratory: Negative for cough, shortness of breath and wheezing.   Cardiovascular: Negative for chest pain, palpitations, orthopnea, claudication and leg swelling.  Gastrointestinal: Negative for abdominal pain, blood in stool, constipation, diarrhea, heartburn, melena, nausea and vomiting.  Genitourinary: Negative.   Musculoskeletal: Negative for joint pain and myalgias.  Skin: Negative for rash.  Neurological: Negative for dizziness, tingling, sensory change, weakness and headaches.  Endo/Heme/Allergies: Negative for polydipsia.  Psychiatric/Behavioral: Negative.   All other systems reviewed and are negative.   Physical Exam: Estimated body mass index is 27.31 kg/m as calculated from the following:   Height as of this encounter: 5' 3.5" (1.613 m).   Weight as of this encounter: 156 lb 9.6 oz (71 kg). BP 118/78   Pulse 69   Temp 97.7 F  (36.5 C)   Ht 5' 3.5" (1.613 m)   Wt 156 lb 9.6 oz (71 kg)   SpO2 99%   BMI 27.31 kg/m  General Appearance: Well nourished, in no apparent distress.  Eyes: PERRLA, EOMs, conjunctiva no swelling or erythema Sinuses: No Frontal/maxillary tenderness  ENT/Mouth: Ext aud canals clear, normal light reflex with TMs without erythema, bulging. Good dentition. No erythema, swelling, or exudate on post pharynx. Tonsils not swollen or erythematous. Hearing normal.  Neck: Supple, approx 0.5 cm smooth nodule to R side of thyroid. No bruits  Respiratory: Respiratory effort normal, BS equal bilaterally without rales, rhonchi, wheezing or stridor.  Cardio: RRR without murmurs, rubs or gallops. Brisk peripheral pulses without edema.  Chest: symmetric, with normal excursions and percussion.  Breasts: breasts appear normal, no suspicious masses, no skin or nipple changes or axillary nodes. Abdomen: Soft, nontender, no guarding, rebound, hernias, masses, or organomegaly.  Lymphatics: Non tender without lymphadenopathy.  Genitourinary: Defer to GYN Musculoskeletal: Full ROM all peripheral extremities,5/5 strength, and normal gait.  Skin: Warm, dry without rashes, lesions, ecchymosis. Neuro: Cranial nerves intact, reflexes equal bilaterally. Normal muscle tone, no cerebellar symptoms. Sensation intact.  Psych: Awake and oriented X 3, normal affect, Insight and Judgment appropriate.   EKG: WNL no ST changes.   Gorden Harms Amyiah Gaba 10:23 AM [facility]

## 2020-05-21 ENCOUNTER — Encounter: Payer: Self-pay | Admitting: Adult Health

## 2020-05-21 ENCOUNTER — Ambulatory Visit
Admission: RE | Admit: 2020-05-21 | Discharge: 2020-05-21 | Disposition: A | Discharge status: Home / Self Care | Payer: 59 | Source: Ambulatory Visit | Attending: Adult Health | Admitting: Adult Health

## 2020-05-21 ENCOUNTER — Other Ambulatory Visit: Payer: Self-pay

## 2020-05-21 ENCOUNTER — Ambulatory Visit (INDEPENDENT_AMBULATORY_CARE_PROVIDER_SITE_OTHER): Payer: 59 | Admitting: Adult Health

## 2020-05-21 VITALS — BP 118/78 | HR 69 | Temp 97.7°F | Ht 63.5 in | Wt 156.6 lb

## 2020-05-21 DIAGNOSIS — I10 Essential (primary) hypertension: Secondary | ICD-10-CM | POA: Diagnosis not present

## 2020-05-21 DIAGNOSIS — I272 Pulmonary hypertension, unspecified: Secondary | ICD-10-CM

## 2020-05-21 DIAGNOSIS — Z0000 Encounter for general adult medical examination without abnormal findings: Secondary | ICD-10-CM

## 2020-05-21 DIAGNOSIS — E785 Hyperlipidemia, unspecified: Secondary | ICD-10-CM

## 2020-05-21 DIAGNOSIS — E041 Nontoxic single thyroid nodule: Secondary | ICD-10-CM

## 2020-05-21 DIAGNOSIS — Z1231 Encounter for screening mammogram for malignant neoplasm of breast: Secondary | ICD-10-CM

## 2020-05-21 DIAGNOSIS — Z131 Encounter for screening for diabetes mellitus: Secondary | ICD-10-CM

## 2020-05-21 DIAGNOSIS — E042 Nontoxic multinodular goiter: Secondary | ICD-10-CM | POA: Insufficient documentation

## 2020-05-21 DIAGNOSIS — Z136 Encounter for screening for cardiovascular disorders: Secondary | ICD-10-CM

## 2020-05-21 DIAGNOSIS — E663 Overweight: Secondary | ICD-10-CM

## 2020-05-21 DIAGNOSIS — Z1329 Encounter for screening for other suspected endocrine disorder: Secondary | ICD-10-CM

## 2020-05-21 DIAGNOSIS — D259 Leiomyoma of uterus, unspecified: Secondary | ICD-10-CM

## 2020-05-21 DIAGNOSIS — E559 Vitamin D deficiency, unspecified: Secondary | ICD-10-CM

## 2020-05-21 DIAGNOSIS — Z1389 Encounter for screening for other disorder: Secondary | ICD-10-CM

## 2020-05-21 DIAGNOSIS — Z1211 Encounter for screening for malignant neoplasm of colon: Secondary | ICD-10-CM

## 2020-05-21 NOTE — Patient Instructions (Addendum)
° °  Dawn Pope , Thank you for taking time to come for your Annual Wellness Visit. I appreciate your ongoing commitment to your health goals. Please review the following plan we discussed and let me know if I can assist you in the future.   These are the goals we discussed: Goals     Blood Pressure < 130/80     DIET - INCREASE WATER INTAKE     Weight (lb) < 140 lb (63.5 kg)       This is a list of the screening recommended for you and due dates:  Health Maintenance  Topic Date Due   Pap Smear  06/14/2013   Mammogram  03/22/2015   COVID-19 Vaccine (2 - Booster for Janssen series) 10/27/2019   Flu Shot  06/21/2020*   Tetanus Vaccine  06/16/2023    Hepatitis C: One time screening is recommended by Center for Disease Control  (CDC) for  adults born from [birth year] through 1965.   Discontinued   HIV Screening  Discontinued  *Topic was postponed. The date shown is not the original due date.    Chest xray at [address] for chest xray   Please call to schedule GYN appointment -  Schedule eye and dental exams   Go ahead and schedule a mammogram   HOW TO SCHEDULE A MAMMOGRAM  [facility]  7 a.m.-6:30 p.m., Monday 7 a.m.-5 p.m., Tuesday-Friday Schedule an appointment by calling [phone].  [facility] Schedule an appointment by calling [phone].      YOU CAN CALL TO MAKE AN ULTRASOUND..  I have put in an order for an ultrasound for you to have You can set them up at your convenience by calling this number [phone] You will likely have the ultrasound at [address]  If you have any issues call our office and we will set this up for you.       Know what a healthy weight is for you (roughly BMI <25) and aim to maintain this  Aim for 7+ servings of fruits and vegetables daily  65-80+ fluid ounces of water or unsweet tea for healthy kidneys  Limit to max 1 drink of alcohol per day; avoid  smoking/tobacco  Limit animal fats in diet for cholesterol and heart health - choose grass fed whenever available  Avoid highly processed foods, and foods high in saturated/trans fats  Aim for low stress - take time to unwind and care for your mental health  Aim for 150 min of moderate intensity exercise weekly for heart health, and weights twice weekly for bone health  Aim for 7-9 hours of sleep daily

## 2020-05-22 LAB — URINALYSIS, ROUTINE W REFLEX MICROSCOPIC
Bacteria, UA: NONE SEEN /HPF
Bilirubin Urine: NEGATIVE
Glucose, UA: NEGATIVE
Hyaline Cast: NONE SEEN /LPF
Ketones, ur: NEGATIVE
Nitrite: NEGATIVE
Protein, ur: NEGATIVE
RBC / HPF: NONE SEEN /HPF (ref 0–2)
Specific Gravity, Urine: 1.01 (ref 1.001–1.03)
pH: 5.5 (ref 5.0–8.0)

## 2020-05-22 LAB — COMPLETE METABOLIC PANEL WITH GFR
AG Ratio: 1.7 (calc) (ref 1.0–2.5)
ALT: 11 U/L (ref 6–29)
AST: 18 U/L (ref 10–35)
Albumin: 4.5 g/dL (ref 3.6–5.1)
Alkaline phosphatase (APISO): 71 U/L (ref 31–125)
BUN: 18 mg/dL (ref 7–25)
CO2: 27 mmol/L (ref 20–32)
Calcium: 9.6 mg/dL (ref 8.6–10.2)
Chloride: 102 mmol/L (ref 98–110)
Creat: 0.76 mg/dL (ref 0.50–1.10)
GFR, Est African American: 107 mL/min/{1.73_m2} (ref >=60)
GFR, Est Non African American: 92 mL/min/{1.73_m2} (ref >=60)
Globulin: 2.7 g/dL (calc) (ref 1.9–3.7)
Glucose, Bld: 85 mg/dL (ref 65–99)
Potassium: 3.8 mmol/L (ref 3.5–5.3)
Sodium: 137 mmol/L (ref 135–146)
Total Bilirubin: 0.5 mg/dL (ref 0.2–1.2)
Total Protein: 7.2 g/dL (ref 6.1–8.1)

## 2020-05-22 LAB — CBC WITH DIFFERENTIAL/PLATELET
Absolute Monocytes: 495 cells/uL (ref 200–950)
Basophils Absolute: 11 cells/uL (ref 0–200)
Basophils Relative: 0.2 %
Eosinophils Absolute: 99 cells/uL (ref 15–500)
Eosinophils Relative: 1.8 %
HCT: 33.5 % — ABNORMAL LOW (ref 35.0–45.0)
Hemoglobin: 11.4 g/dL — ABNORMAL LOW (ref 11.7–15.5)
Lymphs Abs: 1513 cells/uL (ref 850–3900)
MCH: 31.2 pg (ref 27.0–33.0)
MCHC: 34 g/dL (ref 32.0–36.0)
MCV: 91.8 fL (ref 80.0–100.0)
MPV: 11.2 fL (ref 7.5–12.5)
Monocytes Relative: 9 %
Neutro Abs: 3383 cells/uL (ref 1500–7800)
Neutrophils Relative %: 61.5 %
Platelets: 268 10*3/uL (ref 140–400)
RBC: 3.65 10*6/uL — ABNORMAL LOW (ref 3.80–5.10)
RDW: 12 % (ref 11.0–15.0)
Total Lymphocyte: 27.5 %
WBC: 5.5 10*3/uL (ref 3.8–10.8)

## 2020-05-22 LAB — LIPID PANEL
Cholesterol: 176 mg/dL (ref <200)
HDL: 55 mg/dL (ref >=50)
LDL Cholesterol (Calc): 111 mg/dL (calc) — ABNORMAL HIGH
Non-HDL Cholesterol (Calc): 121 mg/dL (calc) (ref <130)
Total CHOL/HDL Ratio: 3.2 (calc) (ref <5.0)
Triglycerides: 35 mg/dL (ref <150)

## 2020-05-22 LAB — TSH: TSH: 1.96 mIU/L

## 2020-05-22 LAB — MICROALBUMIN / CREATININE URINE RATIO
Creatinine, Urine: 56 mg/dL (ref 20–275)
Microalb Creat Ratio: 27 mcg/mg creat (ref <30)
Microalb, Ur: 1.5 mg/dL

## 2020-05-22 LAB — HEMOGLOBIN A1C
Hgb A1c MFr Bld: 5.4 % of total Hgb (ref <5.7)
Mean Plasma Glucose: 108 mg/dL
eAG (mmol/L): 6 mmol/L

## 2020-05-22 LAB — MAGNESIUM: Magnesium: 1.9 mg/dL (ref 1.5–2.5)

## 2020-05-22 LAB — VITAMIN D 25 HYDROXY (VIT D DEFICIENCY, FRACTURES): Vit D, 25-Hydroxy: 50 ng/mL (ref 30–100)

## 2020-06-11 ENCOUNTER — Ambulatory Visit
Admission: RE | Admit: 2020-06-11 | Discharge: 2020-06-11 | Disposition: A | Discharge status: Home / Self Care | Payer: 59 | Source: Ambulatory Visit | Attending: Adult Health | Admitting: Adult Health

## 2020-06-11 DIAGNOSIS — E041 Nontoxic single thyroid nodule: Secondary | ICD-10-CM

## 2020-11-18 DIAGNOSIS — E785 Hyperlipidemia, unspecified: Secondary | ICD-10-CM | POA: Insufficient documentation

## 2020-11-18 DIAGNOSIS — D649 Anemia, unspecified: Secondary | ICD-10-CM | POA: Insufficient documentation

## 2020-11-18 NOTE — Progress Notes (Deleted)
FOLLOW UP  Assessment and Plan:    Hypertension Borderline/fair control with current medication *** Emphasized need to monitor blood pressure at home; patient to call if consistently greater than 130/80 Continue DASH diet.   Reminder to go to the ER if any CP, SOB, nausea, dizziness, severe HA, changes vision/speech, left arm numbness and tingling and jaw pain. Check CMP/GFR  BMI 26 Recommended diet heavy in fruits and veggies and low in animal meats, cheeses, and dairy products, appropriate calorie intake Discussed ideal weight for height  Continue to monitor  Vitamin D Def continue supplementation; she has increased dose as recommended  Defer Vit D level to CPE   Anemia Check CBC; iron levels were resolved to baseline at last visit  Check B12 after discussion with patient   Continue diet and meds as discussed. Further disposition pending results of labs. Discussed med's effects and SE's.   Over 30 minutes of exam, counseling, chart review, and critical decision making was performed.   Future Appointments  Date Time Provider Lily  11/20/2020  8:45 AM Liane Comber, NP [facility] None  05/21/2021  9:00 AM Liane Comber, NP [facility] None    ----------------------------------------------------------------------------------------------------------------------  HPI 50 y.o. female  presents for 6+ month follow up on hypertension, weight and vitamin D deficiency.   She had thyroid US 06/11/2020 showing stable multinodular thryoid, was not recommended for dedicated follow up.   BMI is There is no height or weight on file to calculate BMI., she has been working on diet, could be doing better, some junk food working 3rd shift, she does lift boxes at work and sweats a fair bit at work, plans to do a 5k in 2 months.   Wt Readings from Last 3 Encounters:  05/21/20 156 lb 9.6 oz (71 kg)  12/05/19 157 lb 12.8 oz (71.6 kg)  05/22/19 151 lb (68.5 kg)   She admits  she has not been checking BPs, has cuff from friend but hasn't pulled out, today their BP is   She is currently on ziac due to electrolyte changes and cramps with ACE/ARB and HCTZ  She does workout. She denies chest pain, shortness of breath, dizziness.  The cholesterol last visit was:   Lab Results  Component Value Date   CHOL 176 05/21/2020   HDL 55 05/21/2020   LDLCALC 111 (H) 05/21/2020   TRIG 35 05/21/2020   CHOLHDL 3.2 05/21/2020   Last A1C in the office was:  Lab Results  Component Value Date   HGBA1C 5.4 05/21/2020   Patient is on Vitamin D supplement, taking 10000 IU daily    Lab Results  Component Value Date   VD25OH 74 05/21/2020      She has persistent normocytic anemia, hx of iron def, she is taking iron supplement *** CBC Latest Ref Rng & Units 05/21/2020 12/05/2019 05/22/2019  WBC 3.8 - 10.8 Thousand/uL 5.5 5.8 5.5  Hemoglobin 11.7 - 15.5 g/dL 11.4(L) 12.3 11.2(L)  Hematocrit 35.0 - 45.0 % 33.5(L) 37.6 33.1(L)  Platelets 140 - 400 Thousand/uL 268 216 248   Last iron was over 1 year ago Lab Results  Component Value Date   IRON 48 05/22/2019   TIBC 375 05/22/2019   FERRITIN 25 05/22/2019     Current Medications:  Current Outpatient Medications on File Prior to Visit  Medication Sig   B Complex Vitamins (B COMPLEX PO) Take by mouth daily.   bisoprolol-hydrochlorothiazide (ZIAC) 5-6.25 MG tablet TAKE 1 TABLET DAILY FOR BLOOD PRESSURE  Cholecalciferol (VITAMIN D-3) 5000 UNITS TABS Take 2 tablets by mouth every evening. Take 10,000 IU daily   Ferrous Sulfate (IRON PO) Take by mouth daily.   OVER THE COUNTER MEDICATION Takes Potassium, Magnesium, Calcium once daily   No current facility-administered medications on file prior to visit.     Allergies: No Known Allergies   Medical History:  Past Medical History:  Diagnosis Date   Anemia    COVID-19 12/05/2019   Fibroids    Hypertension    Hypokalemia    Hypomagnesemia    Pulmonary hypertension ([place])  Via Echo 2010   EF 65% RVSP 45 (Dr. Stanford Breed)   Vitamin D deficiency    Family history- Reviewed and unchanged Social history- Reviewed and unchanged   Review of Systems:  Review of Systems  Constitutional:  Negative for malaise/fatigue and weight loss.  HENT:  Negative for hearing loss and tinnitus.   Eyes:  Negative for blurred vision and double vision.  Respiratory:  Negative for cough, shortness of breath and wheezing.   Cardiovascular:  Negative for chest pain, palpitations, orthopnea, claudication and leg swelling.  Gastrointestinal:  Negative for abdominal pain, blood in stool, constipation, diarrhea, heartburn, melena, nausea and vomiting.  Genitourinary: Negative.   Musculoskeletal:  Negative for joint pain and myalgias.  Skin:  Negative for rash.  Neurological:  Negative for dizziness, tingling, sensory change, weakness and headaches.  Endo/Heme/Allergies:  Negative for polydipsia.  Psychiatric/Behavioral: Negative.    All other systems reviewed and are negative.  Physical Exam: There were no vitals taken for this visit. Wt Readings from Last 3 Encounters:  05/21/20 156 lb 9.6 oz (71 kg)  12/05/19 157 lb 12.8 oz (71.6 kg)  05/22/19 151 lb (68.5 kg)   General Appearance: Well nourished, in no apparent distress. Eyes: PERRLA, EOMs, conjunctiva no swelling or erythema Sinuses: No Frontal/maxillary tenderness ENT/Mouth: Ext aud canals clear, TMs without erythema, bulging. No erythema, swelling, or exudate on post pharynx.  Tonsils not swollen or erythematous. Hearing normal.  Neck: Supple, thyroid normal.  Respiratory: Respiratory effort normal, BS equal bilaterally without rales, rhonchi, wheezing or stridor.  Cardio: RRR with no MRGs. Brisk peripheral pulses without edema.  Abdomen: Soft, + BS.  Non tender, no guarding, rebound, hernias, masses. Lymphatics: Non tender without lymphadenopathy.  Musculoskeletal: Full ROM, 5/5 strength, Normal gait Skin: Warm, dry  without rashes, lesions, ecchymosis.  Neuro: Cranial nerves intact. No cerebellar symptoms.  Psych: Awake and oriented X 3, normal affect, Insight and Judgment appropriate.     Izora Ribas, NP 5:23 PM [facility]

## 2020-11-20 ENCOUNTER — Ambulatory Visit: Payer: 59 | Admitting: Adult Health

## 2020-11-25 ENCOUNTER — Other Ambulatory Visit: Payer: Self-pay | Admitting: Adult Health

## 2020-12-22 ENCOUNTER — Emergency Department (HOSPITAL_BASED_OUTPATIENT_CLINIC_OR_DEPARTMENT_OTHER): Payer: Managed Care, Other (non HMO)

## 2020-12-22 ENCOUNTER — Encounter (HOSPITAL_COMMUNITY): Payer: Self-pay

## 2020-12-22 ENCOUNTER — Emergency Department (HOSPITAL_COMMUNITY): Payer: Managed Care, Other (non HMO)

## 2020-12-22 ENCOUNTER — Emergency Department (HOSPITAL_COMMUNITY)
Admission: EM | Admit: 2020-12-22 | Discharge: 2020-12-22 | Disposition: A | Discharge status: Home / Self Care | Payer: Managed Care, Other (non HMO) | Attending: Emergency Medicine | Admitting: Emergency Medicine

## 2020-12-22 ENCOUNTER — Other Ambulatory Visit: Payer: Self-pay

## 2020-12-22 DIAGNOSIS — Z8616 Personal history of COVID-19: Secondary | ICD-10-CM | POA: Insufficient documentation

## 2020-12-22 DIAGNOSIS — Y990 Civilian activity done for income or pay: Secondary | ICD-10-CM | POA: Diagnosis not present

## 2020-12-22 DIAGNOSIS — I10 Essential (primary) hypertension: Secondary | ICD-10-CM | POA: Insufficient documentation

## 2020-12-22 DIAGNOSIS — M79662 Pain in left lower leg: Secondary | ICD-10-CM | POA: Diagnosis not present

## 2020-12-22 DIAGNOSIS — Z79899 Other long term (current) drug therapy: Secondary | ICD-10-CM | POA: Insufficient documentation

## 2020-12-22 DIAGNOSIS — X500XXA Overexertion from strenuous movement or load, initial encounter: Secondary | ICD-10-CM | POA: Diagnosis not present

## 2020-12-22 DIAGNOSIS — S8992XA Unspecified injury of left lower leg, initial encounter: Secondary | ICD-10-CM | POA: Insufficient documentation

## 2020-12-22 DIAGNOSIS — M79605 Pain in left leg: Secondary | ICD-10-CM | POA: Diagnosis not present

## 2020-12-22 DIAGNOSIS — M7989 Other specified soft tissue disorders: Secondary | ICD-10-CM | POA: Diagnosis not present

## 2020-12-22 DIAGNOSIS — S8990XA Unspecified injury of unspecified lower leg, initial encounter: Secondary | ICD-10-CM

## 2020-12-22 MED ORDER — NAPROXEN 500 MG PO TABS: 500.0000 mg | ORAL_TABLET | Freq: Two times a day (BID) | ORAL | 0 refills | Status: DC | PRN

## 2020-12-22 NOTE — ED Triage Notes (Signed)
Pt c/o LLE pain after injuring leg at work x 2 days ago.  Pain w/ walking.  Pain score 2/10.  Slight swelling noted.  Pt ambulatory w/ a slight limp.

## 2020-12-22 NOTE — ED Provider Notes (Signed)
[facility] DEPT Provider Note   CSN: [id number] Arrival date & time: 12/22/20  1637     History Chief Complaint  Patient presents with   Leg Pain    Dawn Pope is a 50 y.o. female.  With a history of hypertension who presents to the emergency department with complaints of left leg injury which occurred 2 days prior while at work.  Patient states that she had her left leg extended with putting a lot of weight on it when she felt a pop sensation in her lower leg.  She has since been having pain to the calf, worse with movement, alleviated some with application of ice.  Denies numbness, tingling, weakness, or other areas of injury.  Denies history of cancer, VTE, hormone use, recent surgery, or recent long travel.   HPI     Past Medical History:  Diagnosis Date   Anemia    COVID-19 12/05/2019   Fibroids    Hypertension    Hypokalemia    Hypomagnesemia    Pulmonary hypertension ([place]) Via Echo 2010   EF 65% RVSP 45 (Dr. Stanford Breed)   Vitamin D deficiency     Patient Active Problem List   Diagnosis Date Noted   Hyperlipidemia 11/18/2020   Anemia 11/18/2020   Multinodular thyroid 05/21/2020   Overweight (BMI 25.0-29.9) 05/20/2020   Fibroid, uterine    Hypertension    Vitamin D deficiency     Past Surgical History:  Procedure Laterality Date   IR GENERIC HISTORICAL  07/04/2014   IR RADIOLOGIST EVAL & MGMT 07/04/2014 Sandi Mariscal, MD [facility] INTERV RAD   UTERINE ARTERY EMBOLIZATION  04/27/2014     OB History   No obstetric history on file.     Family History  Problem Relation Age of Onset   Hypertension Mother    Hyperlipidemia Mother    Hyperlipidemia Father    Hypertension Father    Stroke Father 58   Heart disease Paternal Grandmother     Social History   Tobacco Use   Smoking status: Never   Smokeless tobacco: Never  Vaping Use   Vaping Use: Never used  Substance Use Topics   Alcohol use: No   Drug use: No    Home  Medications Prior to Admission medications   Medication Sig Start Date End Date Taking? Authorizing Provider  B Complex Vitamins (B COMPLEX PO) Take by mouth daily.    [provider]  bisoprolol-hydrochlorothiazide [facility]) 5-6.25 MG tablet Take 1 tablet by mouth once daily for blood pressure 11/25/20   Liane Comber, NP  Cholecalciferol (VITAMIN D-3) 5000 UNITS TABS Take 2 tablets by mouth every evening. Take 10,000 IU daily    [provider]  Ferrous Sulfate (IRON PO) Take by mouth daily.    [provider]  OVER THE COUNTER MEDICATION Takes Potassium, Magnesium, Calcium once daily    [provider]    Allergies    Patient has no known allergies.  Review of Systems   Review of Systems  Constitutional:  Negative for chills and fever.  Respiratory:  Negative for shortness of breath.   Cardiovascular:  Negative for chest pain.  Gastrointestinal:  Negative for abdominal pain and vomiting.  Musculoskeletal:  Positive for myalgias.  Neurological:  Negative for weakness and numbness.  All other systems reviewed and are negative.  Physical Exam Updated Vital Signs BP (!) 163/92 (BP Location: Left Arm)   Pulse 98   Temp 98.1 F (36.7 C) (Oral)  Resp 16   Ht '5\' 3"'$  (1.6 m)   Wt 72.6 kg   SpO2 98%   BMI 28.34 kg/m   Physical Exam Vitals and nursing note reviewed.  Constitutional:      General: She is not in acute distress.    Appearance: She is well-developed. She is not ill-appearing or toxic-appearing.  HENT:     Head: Normocephalic and atraumatic.  Eyes:     General:        Right eye: No discharge.        Left eye: No discharge.     Conjunctiva/sclera: Conjunctivae normal.  Cardiovascular:     Rate and Rhythm: Normal rate and regular rhythm.     Pulses:          Dorsalis pedis pulses are 2+ on the right side and 2+ on the left side.       Posterior tibial pulses are 2+ on the right side and 2+ on the left side.  Pulmonary:      Effort: Pulmonary effort is normal. No respiratory distress.     Breath sounds: Normal breath sounds. No wheezing, rhonchi or rales.  Abdominal:     General: There is no distension.     Palpations: Abdomen is soft.     Tenderness: There is no abdominal tenderness.  Musculoskeletal:     Cervical back: Neck supple.     Comments: Lower extremities: No obvious deformity, appreciable swelling, edema, erythema, ecchymosis, warmth, or open wounds. Patient has intact AROM to bilateral hips, knees, ankles, and all digits.  Tender to palpation to the left calf .  Otherwise nontender.  Negative Thompson test.  Skin:    General: Skin is warm and dry.     Capillary Refill: Capillary refill takes less than 2 seconds.     Findings: No rash.  Neurological:     Mental Status: She is alert.     Comments: Alert. Clear speech. Sensation grossly intact to bilateral lower extremities. 5/5 strength with plantar/dorsiflexion bilaterally. Patient ambulatory  Psychiatric:        Mood and Affect: Mood normal.        Behavior: Behavior normal.    ED Results / Procedures / Treatments   Labs (all labs ordered are listed, but only abnormal results are displayed) Labs Reviewed - No data to display  EKG None  Radiology DG Tibia/Fibula Left  Result Date: 12/22/2020 CLINICAL DATA:  injury EXAM: LEFT TIBIA AND FIBULA - 2 VIEW COMPARISON:  None. FINDINGS: No acute fracture or dislocation. Enthesophyte of the quadriceps tendon insertion on the patella. Joint spaces and alignment are maintained. No area of erosion or osseous destruction. No unexpected radiopaque foreign body. Soft tissues are unremarkable. IMPRESSION: No acute fracture or dislocation. Electronically Signed   By: Valentino Saxon MD   On: 12/22/2020 18:51   VAS [place] LOWER EXTREMITY VENOUS (DVT) (ONLY [facility] & [facility])  Result Date: 12/22/2020  Lower Venous DVT Study Patient Name:  Dawn Pope  Date of Exam:   12/22/2020 Medical Rec #: [id number]      Accession  #:    [id number] Date of Birth: 1971-02-04      Patient Gender: F Patient Age:   89Y Exam Location:  [facility] Procedure:      VAS [place] LOWER EXTREMITY VENOUS (DVT) Referring Phys: [id number] [facility] R Carma Dwiggins --------------------------------------------------------------------------------  Indications: Pain, Swelling, and Felt pop in left calf 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  Comparison Study: No prior study on file Performing Technologist: Sharion Dove RVS  Examination Guidelines: A complete evaluation includes B-mode imaging, spectral Doppler, color Doppler, and power Doppler as needed of all accessible portions of each vessel. Bilateral testing is considered an integral part of a complete examination. Limited examinations for reoccurring indications may be performed as noted. The reflux portion of the exam is performed with the patient in reverse Trendelenburg.  +-----+---------------+---------+-----------+----------+--------------+ RIGHTCompressibilityPhasicitySpontaneityPropertiesThrombus Aging +-----+---------------+---------+-----------+----------+--------------+ CFV  Full           Yes      Yes                                 +-----+---------------+---------+-----------+----------+--------------+   +---------+---------------+---------+-----------+----------+--------------+ LEFT     CompressibilityPhasicitySpontaneityPropertiesThrombus Aging +---------+---------------+---------+-----------+----------+--------------+ CFV      Full           Yes      Yes                                 +---------+---------------+---------+-----------+----------+--------------+ SFJ      Full                                                        +---------+---------------+---------+-----------+----------+--------------+ FV Prox  Full                                                         +---------+---------------+---------+-----------+----------+--------------+ FV Mid   Full                                                        +---------+---------------+---------+-----------+----------+--------------+ FV DistalFull                                                        +---------+---------------+---------+-----------+----------+--------------+ PFV      Full                                                        +---------+---------------+---------+-----------+----------+--------------+ POP      Full           Yes      Yes                                 +---------+---------------+---------+-----------+----------+--------------+ PTV      Full                                                        +---------+---------------+---------+-----------+----------+--------------+  PERO     Full                                                        +---------+---------------+---------+-----------+----------+--------------+     Summary: RIGHT: - No evidence of common femoral vein obstruction.  LEFT: - There is no evidence of deep vein thrombosis in the lower extremity.  - Fluid noted around calf muscle, possibly consistent with muscle tear  *See table(s) above for measurements and observations.    Preliminary     Procedures Procedures   Medications Ordered in ED Medications - No data to display  ED Course  I have reviewed the triage vital signs and the nursing notes.  Pertinent labs & imaging results that were available during my care of the patient were reviewed by me and considered in my medical decision making (see chart for details).    MDM Rules/Calculators/A&P                           Patient presents to the ED with complaints of left lower leg pain.  Nontoxic, blood pressure elevated, doubt hypertensive emergency.   Additional history obtained:  Additional history obtained from chart review & nursing note review.   Imaging Studies  ordered:  I ordered imaging studies which included left hip/fib x-ray and left lower extremity venous duplex, I independently reviewed, formal radiology impression shows:  Left tip/fib x-ray: Negative Left lower extremity venous duplex: No evidence of common femoral vein obstruction.  LEFT: - There is no evidence of deep vein thrombosis in the lower extremity.  - Fluid noted around calf muscle, possibly consistent with muscle tear    ED Course:  No signs of infection.  X-ray without fracture or dislocation.  Ultrasound without DVT, does have findings suspicious for calf muscle tear, this is clinically consistent with patient presentation.  She is neurovascularly intact distally.  She is able to plantar/dorsiflex against resistance.  Does not have findings consistent with an Achilles tendon tear.  Given cam walker for support. Naproxen- last creatine WNL. PRICE. Ortho follow up.   I discussed results, treatment plan, need for follow-up, and return precautions with the patient. Provided opportunity for questions, patient confirmed understanding and is in agreement with plan.   Portions of this note were generated with [employer]. Dictation errors may occur despite best attempts at proofreading.  Final Clinical Impression(s) / ED Diagnoses Final diagnoses:  Injury of calf    Rx / DC Orders ED Discharge Orders          Ordered    naproxen (NAPROSYN) 500 MG tablet  2 times daily PRN        12/22/20 1933             Amaryllis Dyke, PA-C 12/22/20 1945    Regan Lemming, MD 12/23/20 0025

## 2020-12-22 NOTE — Progress Notes (Signed)
VASCULAR LAB    Left lower extremity venous duplex has been performed.  See CV proc for preliminary results.  Gave verbal report  Dimitrious Micciche, RVT 12/22/2020, 7:12 PM

## 2020-12-22 NOTE — Discharge Instructions (Addendum)
Please read and follow all provided instructions.  You have been seen today for a left calf injury, we are concerned that you injured the muscle, your ultrasound showed that you may have torn the calf muscle some.  Tests performed today include: An x-ray of the affected area - does NOT show any broken bones or dislocations.  Ultrasound of the area does not show a blood clot. Vital signs. See below for your results today.    Home care instructions: -- *PRICE in the first 24-48 hours after injury: Protect (with brace, splint, sling), if given by your provider Rest Ice- Do not apply ice pack directly to your skin, place towel or similar between your skin and ice/ice pack. Apply ice for 20 min, then remove for 40 min while awake Compression- Wear brace, elastic bandage, splint as directed by your provider Elevate affected extremity above the level of your heart when not walking around for the first 24-48 hours   Medications:  - Naproxen is a nonsteroidal anti-inflammatory medication that will help with pain and swelling. Be sure to take this medication as prescribed with food, 1 pill every 12 hours,  It should be taken with food, as it can cause stomach upset, and more seriously, stomach bleeding. Do not take other nonsteroidal anti-inflammatory medications with this such as Advil, Motrin, Aleve, Mobic, Goodie Powder, or Motrin.    You make take Tylenol per over the counter dosing with these medications.   We have prescribed you new medication(s) today. Discuss the medications prescribed today with your pharmacist as they can have adverse effects and interactions with your other medicines including over the counter and prescribed medications. Seek medical evaluation if you start to experience new or abnormal symptoms after taking one of these medicines, seek care immediately if you start to experience difficulty breathing, feeling of your throat closing, facial swelling, or rash as these could be  indications of a more serious allergic reaction   Follow-up instructions: Please follow-up with your primary care provider or the provided orthopedic physician (bone specialist) if you continue to have significant pain in 1 week. In this case you may have a more severe injury that requires further care.   Return instructions:  Please return if your digits or extremity are numb or tingling, appear gray or blue, or you have severe pain (also elevate the extremity and loosen splint or wrap if you were given one) Please return if you have redness or fevers.  Please return to the Emergency Department if you experience worsening symptoms.  Please return if you have any other emergent concerns. Additional Information:  Your vital signs today were: BP (!) 163/92 (BP Location: Left Arm)   Pulse 98   Temp 98.1 F (36.7 C) (Oral)   Resp 16   Ht '5\' 3"'$  (1.6 m)   Wt 72.6 kg   LMP 11/18/2020 (Approximate)   SpO2 98%   BMI 28.34 kg/m  If your blood pressure (BP) was elevated above 135/85 this visit, please have this repeated by your doctor within one month. ---------------

## 2021-04-29 NOTE — Progress Notes (Signed)
FOLLOW UP  Assessment and Plan:   Dawn Pope was seen today for follow-up.  Diagnoses and all orders for this visit:  Primary hypertension Continue medications Monitor blood pressure at home; call if consistently over 130/80 Continue DASH diet.   Reminder to go to the ER if any CP, SOB, nausea, dizziness, severe HA, changes vision/speech, left arm numbness and tingling and jaw pain.  Flu vaccine need -     Flu Vaccine QUAD 6+ mos PF IM (Fluarix Quad PF)  Numbness and tingling in both hands Benign exam; neg carpal tunnel; neg spurling's Likely positional cervical etiology;  Avoid sleeping on side; use supportive pillow Neck exercises and stretches reviewed and handout given  Follow up if persistent and plan cervical imaging  Continue diet and meds as discussed. Further disposition pending results of labs. Discussed med's effects and SE's.   Over 30 minutes of exam, counseling, chart review, and critical decision making was performed.   Future Appointments  Date Time Provider [place]  05/21/2021  9:00 AM Liane Comber, NP [facility] None    ----------------------------------------------------------------------------------------------------------------------  HPI 50 y.o. female  presents for 6+ month follow up on hypertension, weight and vitamin D deficiency.   She shares currently out of insurance, will get with the new year in 1 month. Will will defer labs.   She is concerned about walking up at night with whole hand to wrist tingling and numb; sleeps on her side. Denies daytime sx. Will resolve quickly with position change. Denies neck pain.   BMI is Body mass index is 28.45 kg/m., she has been working on diet, could be doing better.  Wt Readings from Last 3 Encounters:  04/30/21 160 lb 9.6 oz (72.8 kg)  12/22/20 160 lb (72.6 kg)  05/21/20 156 lb 9.6 oz (71 kg)    Today their BP is BP: 122/80 She is currently on ziac due to electrolyte changes and cramps  with ACE/ARB and HCTZ  She does workout. She denies chest pain, shortness of breath, dizziness.  The cholesterol last visit was:   Lab Results  Component Value Date   CHOL 176 05/21/2020   HDL 55 05/21/2020   LDLCALC 111 (H) 05/21/2020   TRIG 35 05/21/2020   CHOLHDL 3.2 05/21/2020   Last A1C in the office was:  Lab Results  Component Value Date   HGBA1C 5.4 05/21/2020   Patient is on Vitamin D supplement, taking 10000 IU daily    Lab Results  Component Value Date   VD25OH 50 05/21/2020      She has persistent normocytic anemia, hx of iron def CBC Latest Ref Rng & Units 05/21/2020 12/05/2019 05/22/2019  WBC 3.8 - 10.8 Thousand/uL 5.5 5.8 5.5  Hemoglobin 11.7 - 15.5 g/dL 11.4(L) 12.3 11.2(L)  Hematocrit 35.0 - 45.0 % 33.5(L) 37.6 33.1(L)  Platelets 140 - 400 Thousand/uL 268 216 248    Iron was back within normal range:  Lab Results  Component Value Date   IRON 48 05/22/2019   TIBC 375 05/22/2019   FERRITIN 25 05/22/2019     Current Medications:  Current Outpatient Medications on File Prior to Visit  Medication Sig   B Complex Vitamins (B COMPLEX PO) Take by mouth daily.   bisoprolol-hydrochlorothiazide (ZIAC) 5-6.25 MG tablet Take 1 tablet by mouth once daily for blood pressure   Cholecalciferol (VITAMIN D-3) 5000 UNITS TABS Take 2 tablets by mouth every evening. Take 10,000 IU daily   Ferrous Sulfate (IRON PO) Take by mouth daily.  OVER THE COUNTER MEDICATION Takes Potassium, Magnesium, Calcium once daily   naproxen (NAPROSYN) 500 MG tablet Take 1 tablet (500 mg total) by mouth 2 (two) times daily as needed for moderate pain. (Patient not taking: Reported on 04/30/2021)   No current facility-administered medications on file prior to visit.     Allergies: No Known Allergies   Medical History:  Past Medical History:  Diagnosis Date   Anemia    COVID-19 12/05/2019   Fibroids    Hypertension    Hypokalemia    Hypomagnesemia    Pulmonary hypertension ([place]) Via  Echo 2010   EF 65% RVSP 45 (Dr. Stanford Breed)   Vitamin D deficiency    Family history- Reviewed and unchanged Social history- Reviewed and unchanged   Review of Systems:  Review of Systems  Constitutional:  Negative for malaise/fatigue and weight loss.  HENT:  Negative for hearing loss and tinnitus.   Eyes:  Negative for blurred vision and double vision.  Respiratory:  Negative for cough, shortness of breath and wheezing.   Cardiovascular:  Negative for chest pain, palpitations, orthopnea, claudication and leg swelling.  Gastrointestinal:  Negative for abdominal pain, blood in stool, constipation, diarrhea, heartburn, melena, nausea and vomiting.  Genitourinary: Negative.   Musculoskeletal:  Negative for joint pain and myalgias.  Skin:  Negative for rash.  Neurological:  Positive for tingling and sensory change (bil hands, when sleeping only). Negative for dizziness, weakness and headaches.  Endo/Heme/Allergies:  Negative for polydipsia.  Psychiatric/Behavioral: Negative.    All other systems reviewed and are negative.  Physical Exam: BP 122/80   Pulse 85   Temp (!) 97.3 F (36.3 C)   Wt 160 lb 9.6 oz (72.8 kg)   SpO2 99%   BMI 28.45 kg/m  Wt Readings from Last 3 Encounters:  04/30/21 160 lb 9.6 oz (72.8 kg)  12/22/20 160 lb (72.6 kg)  05/21/20 156 lb 9.6 oz (71 kg)   General Appearance: Well nourished, in no apparent distress. Eyes: PERRLA, conjunctiva no swelling or erythema ENT/Mouth: mask in place; Hearing normal.  Neck: Supple, thyroid normal.  Respiratory: Respiratory effort normal, BS equal bilaterally without rales, rhonchi, wheezing or stridor.  Cardio: RRR with no MRGs. Brisk peripheral pulses without edema.  Abdomen: Soft, + BS.  Non tender, no guarding, rebound, hernias, masses. Lymphatics: Non tender without lymphadenopathy.  Musculoskeletal: no obvious deformity; normal gait. Neg phalen's, full cervical ROM, neg spurling's. Tight traps bil.  Skin: Warm, dry  without rashes, lesions, ecchymosis.  Neuro: Normal muscle tone. Sensation and reflexes intact bil upper extremities.  Psych: Awake and oriented X 3, normal affect, Insight and Judgment appropriate.     Izora Ribas, NP 10:05 AM [facility]

## 2021-04-30 ENCOUNTER — Encounter: Payer: Self-pay | Admitting: Adult Health

## 2021-04-30 ENCOUNTER — Other Ambulatory Visit: Payer: Self-pay

## 2021-04-30 ENCOUNTER — Ambulatory Visit (INDEPENDENT_AMBULATORY_CARE_PROVIDER_SITE_OTHER): Payer: Self-pay | Admitting: Adult Health

## 2021-04-30 VITALS — BP 122/80 | HR 85 | Temp 97.3°F | Wt 160.6 lb

## 2021-04-30 DIAGNOSIS — I10 Essential (primary) hypertension: Secondary | ICD-10-CM

## 2021-04-30 DIAGNOSIS — E042 Nontoxic multinodular goiter: Secondary | ICD-10-CM

## 2021-04-30 DIAGNOSIS — Z23 Encounter for immunization: Secondary | ICD-10-CM

## 2021-04-30 DIAGNOSIS — E559 Vitamin D deficiency, unspecified: Secondary | ICD-10-CM

## 2021-04-30 DIAGNOSIS — D649 Anemia, unspecified: Secondary | ICD-10-CM

## 2021-04-30 DIAGNOSIS — E785 Hyperlipidemia, unspecified: Secondary | ICD-10-CM

## 2021-04-30 DIAGNOSIS — R202 Paresthesia of skin: Secondary | ICD-10-CM

## 2021-04-30 DIAGNOSIS — R200 Anesthesia of skin: Secondary | ICD-10-CM

## 2021-04-30 DIAGNOSIS — E663 Overweight: Secondary | ICD-10-CM

## 2021-04-30 NOTE — Patient Instructions (Signed)
Neck Exercises Ask your health care provider which exercises are safe for you. Do exercises exactly as told by your health care provider and adjust them as directed. It is normal to feel mild stretching, pulling, tightness, or discomfort as you do these exercises. Stop right away if you feel sudden pain or your pain gets worse. Do not begin these exercises until told by your health care provider. Neck exercises can be important for many reasons. They can improve strength and maintain flexibility in your neck, which will help your upper back and prevent neck pain. Stretching exercises Rotation neck stretching  Sit in a chair or stand up. Place your feet flat on the floor, shoulder-width apart. Slowly turn your head (rotate) to the right until a slight stretch is felt. Turn it all the way to the right so you can look over your right shoulder. Do not tilt or tip your head. Hold this position for 10-30 seconds. Slowly turn your head (rotate) to the left until a slight stretch is felt. Turn it all the way to the left so you can look over your left shoulder. Do not tilt or tip your head. Hold this position for 10-30 seconds. Repeat __________ times. Complete this exercise __________ times a day. Neck retraction  Sit in a sturdy chair or stand up. Look straight ahead. Do not bend your neck. Use your fingers to push your chin backward (retraction). Do not bend your neck for this movement. Continue to face straight ahead. If you are doing the exercise properly, you will feel a slight sensation in your throat and a stretch at the back of your neck. Hold the stretch for 1-2 seconds. Repeat __________ times. Complete this exercise __________ times a day. Strengthening exercises Neck press  Lie on your back on a firm bed or on the floor with a pillow under your head. Use your neck muscles to push your head down on the pillow and straighten your spine. Hold the position as well as you can. Keep your head  facing up (in a neutral position) and your chin tucked. Slowly count to 5 while holding this position. Repeat __________ times. Complete this exercise __________ times a day. Isometrics These are exercises in which you strengthen the muscles in your neck while keeping your neck still (isometrics). Sit in a supportive chair and place your hand on your forehead. Keep your head and face facing straight ahead. Do not flex or extend your neck while doing isometrics. Push forward with your head and neck while pushing back with your hand. Hold for 10 seconds. Do the sequence again, this time putting your hand against the back of your head. Use your head and neck to push backward against the hand pressure. Finally, do the same exercise on either side of your head, pushing sideways against the pressure of your hand. Repeat __________ times. Complete this exercise __________ times a day. Prone head lifts  Lie face-down (prone position), resting on your elbows so that your chest and upper back are raised. Start with your head facing downward, near your chest. Position your chin either on or near your chest. Slowly lift your head upward. Lift until you are looking straight ahead. Then continue lifting your head as far back as you can comfortably stretch. Hold your head up for 5 seconds. Then slowly lower it to your starting position. Repeat __________ times. Complete this exercise __________ times a day. Supine head lifts  Lie on your back (supine position), bending your knees   to point to the ceiling and keeping your feet flat on the floor. Lift your head slowly off the floor, raising your chin toward your chest. Hold for 5 seconds. Repeat __________ times. Complete this exercise __________ times a day. Scapular retraction  Stand with your arms at your sides. Look straight ahead. Slowly pull both shoulders (scapulae) backward and downward (retraction) until you feel a stretch between your shoulder  blades in your upper back. Hold for 10-30 seconds. Relax and repeat. Repeat __________ times. Complete this exercise __________ times a day. Contact a health care provider if: Your neck pain or discomfort gets worse when you do an exercise. Your neck pain or discomfort does not improve within 2 hours after you exercise. If you have any of these problems, stop exercising right away. Do not do the exercises again unless your health care provider says that you can. Get help right away if: You develop sudden, severe neck pain. If this happens, stop exercising right away. Do not do the exercises again unless your health care provider says that you can. This information is not intended to replace advice given to you by your health care provider. Make sure you discuss any questions you have with your health care provider. Document Revised: 11/12/2020 Document Reviewed: 11/12/2020 Elsevier Patient Education  2022 [employer]vier Inc.  

## 2021-05-21 ENCOUNTER — Encounter: Payer: 59 | Admitting: Adult Health

## 2021-05-30 ENCOUNTER — Other Ambulatory Visit: Payer: Self-pay | Admitting: Adult Health

## 2021-06-16 NOTE — Progress Notes (Signed)
Complete Physical  Assessment and Plan:  Dawn Pope was seen today for annual exam.  Diagnoses and all orders for this visit:  Encounter for routine adult health examination without abnormal findings Out of influenza vaccine in office - encouraged to get at pharmacy Schedule GYN appointment for PAP  Increase fluid intake   Pulmonary hypertension ([place]) Asymptomatic; continue to monitor; Dawn Pope declines further workup/follow up - CXR  Essential hypertension Ziac was too expensive- change to Bisoprolol 5 mg and Hydrochlorothiazide 12.5 mg daily Monitor blood pressure at home; call if consistently over 130/80 Continue DASH diet.   Reminder to go to the ER if any CP, SOB, nausea, dizziness, severe HA, changes vision/speech, left arm numbness and tingling and jaw pain. -     CBC with Differential/Platelet -     COMPLETE METABOLIC PANEL WITH GFR -     Magnesium -     Urinalysis, Routine w reflex microscopic -     Microalbumin / creatinine urine ratio -     EKG 12-Lead  Uterine leiomyoma, unspecified location Follow up with GYN  Vitamin D deficiency -     VITAMIN D 25 Hydroxy (Vit-D Deficiency, Fractures)  Iron deficiency anemia, unspecified iron deficiency anemia type -     CBC with Differential/Platelet  Hyperlipidemia Mild elevations treated by lifestyle  Continue low cholesterol diet and exercise.  Check lipid panel.  -     Lipid panel -     TSH  Overweight BMI 27.0-27.9 Long discussion about weight loss, diet, and exercise Recommended diet heavy in fruits and veggies and low in animal meats, cheeses, and dairy products, appropriate calorie intake Follow up at next visit   Screening for thyroid disorder -     TSH  Screening for diabetes mellitus -     Hemoglobin A1c   Screening for colon cancer -     Cologuard  Thyroid nodule -    Had repeat U/S 06/11/20 stable no follow up recommended  Carpal tunnel of left wrist Given exercises and recommend cock up  splint Monitor symptoms and if worsen send to orthopedics  Encounter for screening mammogram for malignant neoplasm of breast -     Will call the breast center to schedule   Discussed med's effects and SE's. Screening labs and tests as requested with regular follow-up as recommended. Over 40 minutes of exam, counseling, chart review, and complex, high level critical decision making was performed this visit.   Future Appointments  Date Time Provider [place]  06/17/2022  9:00 AM [name], NP [facility] None     HPI  51 y.o. female  presents for a complete physical and follow up for has Hypertension; Vitamin D deficiency; Fibroid, uterine; Overweight (BMI 25.0-29.9); Multinodular thyroid; Hyperlipidemia; and Anemia on their problem list..  Dawn Pope is single, 2 kids 89 and 23, no grand kids. Single and happy. 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 Does a lot of volunteering.   Dawn Pope has a known uterine fibroid, underwent uterine artery embolization in 2015, sx improved since then, continues to follow with GYN - [facility]. Last Pap 2 years ago  BMI is Body mass index is 27.43 kg/m., Dawn Pope has been working on diet, avoids red meat, pushes fruits/vegetable intake, drinks protein drinks twice weekly. 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  Wt Readings from Last 3 Encounters:  06/17/21 159 lb 12.8 oz (72.5 kg)  04/30/21 160 lb 9.6 oz (72.8 kg)  12/22/20 160 lb (72.6 kg)  Dawn Pope also has documented history of pulmonary hypertension apparently noted on an ECHO in 2010- Dawn Pope is unaware of this diagnosis, but denies exertional dyspnea, lethargy, fatigue, snoring, AM headaches. Dawn Pope declines follow up on this.   Her blood pressure has been controlled at home, today their BP is BP: 114/68  BP Readings from Last 3 Encounters:  06/17/21 114/68  04/30/21 122/80  12/22/20 (!) 172/106    Dawn Pope does not workout but works physically intense job. Dawn Pope denies  chest pain, shortness of breath, dizziness.   Dawn Pope is not on cholesterol medication and denies myalgias. Her cholesterol is not at goal. The cholesterol last visit was:   Lab Results  Component Value Date   CHOL 176 05/21/2020   HDL 55 05/21/2020   LDLCALC 111 (H) 05/21/2020   TRIG 35 05/21/2020   CHOLHDL 3.2 05/21/2020   Dawn Pope has been working on diet and exercise for glucose management, and denies increased appetite, nausea, paresthesia of the feet, polydipsia, polyuria and visual disturbances. Last A1C in the office was:  Lab Results  Component Value Date   HGBA1C 5.4 05/21/2020   Last GFR: Lab Results  Component Value Date   GFRAA 107 05/21/2020   Dawn Pope is on Vitamin D supplement, Dawn Pope takes 10000 daily.  Lab Results  Component Value Date   VD25OH 40 05/21/2020     Dawn Pope has menstrual cycles, states they can occasionally be heavy but are not as bad as they used to be, has been on iron supplement in the last year, taking every other day only, reports gave blood last week  Dawn Pope has been noticing some numbness and tingling occurring on left thumb and first 2 fingers- Dawn Pope does work at [employer] center and does a lot of lifting.  Current Medications:  Current Outpatient Medications on File Prior to Visit  Medication Sig Dispense Refill   B Complex Vitamins (B COMPLEX PO) Take by mouth daily.     bisoprolol-hydrochlorothiazide (ZIAC) 5-6.25 MG tablet Take 1 tablet by mouth once daily for blood pressure 90 tablet 1   Cholecalciferol (VITAMIN D-3) 5000 UNITS TABS Take 2 tablets by mouth every evening. Take 10,000 IU daily     Ferrous Sulfate (IRON PO) Take by mouth daily.     OVER THE COUNTER MEDICATION Takes Potassium, Magnesium, Calcium once daily     naproxen (NAPROSYN) 500 MG tablet Take 1 tablet (500 mg total) by mouth 2 (two) times daily as needed for moderate pain. (Dawn Pope not taking: Reported on 04/30/2021) 15 tablet 0   No current facility-administered medications  on file prior to visit.   Allergies:  No Known Allergies Medical History:  Dawn Pope has Hypertension; Vitamin D deficiency; Fibroid, uterine; Overweight (BMI 25.0-29.9); Multinodular thyroid; Hyperlipidemia; and Anemia on their problem list. Health Maintenance:   Immunization History  Administered Date(s) Administered   Influenza,inj,Quad PF,6+ Mos 04/30/2021   Janssen (J&J) SARS-COV-2 Vaccination 09/01/2019   Pneumococcal-Unspecified 06/14/2001   Td 06/14/2001   Tdap 06/15/2013   Tetanus: 2015 Pneumovax: 2003 Flu vaccine: 04/30/21 Zostavax: N/A covid 19: J&J 2021, will get booster   Pap: Reports ? 2018 OB/GYN, will call to schedule MGM: was getting at GYN, but not going annually - given phone number to schedule with breast center  DEXA: N/A  Colonoscopy: DUE, never scheduled willing to do cologuard EGD: N/A Echo: 2010 PHTN 45, EF 65%  Vision- unknown name, last remote, recommended to schedule Dentist- Dr. Arrie Aran, last 2020, needs to schedule   Dawn Pope Care Team:  Unk Pinto, MD as PCP - General (Internal Medicine)  Surgical History:  Dawn Pope has a past surgical history that includes Uterine artery embolization (04/27/2014) and ir generic historical (07/04/2014). Family History:  Herfamily history includes Heart disease in her paternal grandmother; Hyperlipidemia in her father and mother; Hypertension in her father and mother; Stroke (age of onset: 20) in her father. Social History:  Dawn Pope reports that Dawn Pope has never smoked. Dawn Pope has never used smokeless tobacco. Dawn Pope reports that Dawn Pope does not drink alcohol and does not use drugs.  Review of Systems: Review of Systems  Constitutional:  Negative for malaise/fatigue and weight loss.  HENT:  Negative for hearing loss and tinnitus.   Eyes:  Negative for blurred vision and double vision.  Respiratory:  Negative for cough, shortness of breath and wheezing.   Cardiovascular:  Negative for chest pain, palpitations, orthopnea, claudication  and leg swelling.  Gastrointestinal:  Negative for abdominal pain, blood in stool, constipation, diarrhea, heartburn, melena, nausea and vomiting.  Genitourinary: Negative.   Musculoskeletal:  Negative for joint pain and myalgias.  Skin:  Negative for rash.  Neurological:  Positive for tingling (left thumb and first 2 fingers). Negative for dizziness, sensory change, weakness and headaches.  Endo/Heme/Allergies:  Negative for polydipsia.  Psychiatric/Behavioral: Negative.    All other systems reviewed and are negative.  Physical Exam: Estimated body mass index is 27.43 kg/m as calculated from the following:   Height as of this encounter: 5\' 4"  (1.626 m).   Weight as of this encounter: 159 lb 12.8 oz (72.5 kg). BP 114/68    Pulse 82    Temp (!) 97.3 F (36.3 C)    Ht 5\' 4"  (1.626 m)    Wt 159 lb 12.8 oz (72.5 kg)    LMP 05/12/2021    SpO2 97%    BMI 27.43 kg/m  General Appearance: Well nourished, in no apparent distress.  Eyes: PERRLA, EOMs, conjunctiva no swelling or erythema Sinuses: No Frontal/maxillary tenderness  ENT/Mouth: Ext aud canals clear, normal light reflex with TMs without erythema, bulging. Good dentition. No erythema, swelling, or exudate on post pharynx. Tonsils not swollen or erythematous. Hearing normal.  Neck: Supple, approx 0.5 cm smooth nodule to R side of thyroid. No bruits  Respiratory: Respiratory effort normal, BS equal bilaterally without rales, rhonchi, wheezing or stridor.  Cardio: RRR without murmurs, rubs or gallops. Brisk peripheral pulses without edema.  Chest: symmetric, with normal excursions and percussion.  Breasts: breasts appear normal, no suspicious masses, no skin or nipple changes or axillary nodes. Abdomen: Soft, nontender, no guarding, rebound, hernias, masses, or organomegaly.  Lymphatics: Non tender without lymphadenopathy.  Genitourinary: Defer to GYN Musculoskeletal: Full ROM all peripheral extremities,5/5 strength, and normal gait.  Skin:  Warm, dry without rashes, lesions, ecchymosis. Neuro: Cranial nerves intact, reflexes equal bilaterally. Normal muscle tone, no cerebellar symptoms. Sensation intact.  Psych: Awake and oriented X 3, normal affect, Insight and Judgment appropriate.   EKG: NSR, no ST changes.   Anterio Scheel W Lynne Takemoto 9:07 AM [place] [facility]

## 2021-06-17 ENCOUNTER — Other Ambulatory Visit: Payer: Self-pay

## 2021-06-17 ENCOUNTER — Encounter: Payer: Self-pay | Admitting: Nurse Practitioner

## 2021-06-17 ENCOUNTER — Ambulatory Visit (INDEPENDENT_AMBULATORY_CARE_PROVIDER_SITE_OTHER): Payer: BC Managed Care – PPO | Admitting: Nurse Practitioner

## 2021-06-17 VITALS — BP 114/68 | HR 82 | Temp 97.3°F | Ht 64.0 in | Wt 159.8 lb

## 2021-06-17 DIAGNOSIS — I10 Essential (primary) hypertension: Secondary | ICD-10-CM | POA: Diagnosis not present

## 2021-06-17 DIAGNOSIS — Z1389 Encounter for screening for other disorder: Secondary | ICD-10-CM

## 2021-06-17 DIAGNOSIS — I272 Pulmonary hypertension, unspecified: Secondary | ICD-10-CM

## 2021-06-17 DIAGNOSIS — Z136 Encounter for screening for cardiovascular disorders: Secondary | ICD-10-CM | POA: Diagnosis not present

## 2021-06-17 DIAGNOSIS — Z79899 Other long term (current) drug therapy: Secondary | ICD-10-CM | POA: Diagnosis not present

## 2021-06-17 DIAGNOSIS — Z131 Encounter for screening for diabetes mellitus: Secondary | ICD-10-CM | POA: Diagnosis not present

## 2021-06-17 DIAGNOSIS — D509 Iron deficiency anemia, unspecified: Secondary | ICD-10-CM

## 2021-06-17 DIAGNOSIS — Z13220 Encounter for screening for lipoid disorders: Secondary | ICD-10-CM | POA: Diagnosis not present

## 2021-06-17 DIAGNOSIS — E559 Vitamin D deficiency, unspecified: Secondary | ICD-10-CM | POA: Diagnosis not present

## 2021-06-17 DIAGNOSIS — E663 Overweight: Secondary | ICD-10-CM

## 2021-06-17 DIAGNOSIS — Z1329 Encounter for screening for other suspected endocrine disorder: Secondary | ICD-10-CM

## 2021-06-17 DIAGNOSIS — D259 Leiomyoma of uterus, unspecified: Secondary | ICD-10-CM

## 2021-06-17 DIAGNOSIS — Z0000 Encounter for general adult medical examination without abnormal findings: Secondary | ICD-10-CM | POA: Diagnosis not present

## 2021-06-17 DIAGNOSIS — Z0001 Encounter for general adult medical examination with abnormal findings: Secondary | ICD-10-CM

## 2021-06-17 DIAGNOSIS — Z1211 Encounter for screening for malignant neoplasm of colon: Secondary | ICD-10-CM

## 2021-06-17 DIAGNOSIS — Z6827 Body mass index (BMI) 27.0-27.9, adult: Secondary | ICD-10-CM

## 2021-06-17 DIAGNOSIS — G5602 Carpal tunnel syndrome, left upper limb: Secondary | ICD-10-CM

## 2021-06-17 DIAGNOSIS — E785 Hyperlipidemia, unspecified: Secondary | ICD-10-CM

## 2021-06-17 MED ORDER — BISOPROLOL FUMARATE 5 MG PO TABS: 5.0000 mg | ORAL_TABLET | Freq: Every day | ORAL | 3 refills | Status: DC

## 2021-06-17 MED ORDER — HYDROCHLOROTHIAZIDE 12.5 MG PO TABS: 25.0000 mg | ORAL_TABLET | Freq: Every day | ORAL | 3 refills | Status: DC

## 2021-06-17 NOTE — Patient Instructions (Signed)
Preventing Carpal Tunnel Syndrome Carpal tunnel syndrome is a condition that causes pain, numbness, and weakness in the wrist, hand, and fingers. The carpal tunnel is a narrow, rigid space in the wrist. Tendons and the median nerve pass through the carpal tunnel. The median nerve is the main nerve in the hand. The median nerve gives sensation or feeling to the thumb, the muscles at the base of the thumb, and the first three fingers. Carpal tunnel syndrome happens when the median nerve gets squeezed in the area where it passes through the carpal tunnel. In some cases, it may not be possible to prevent carpal tunnel syndrome. However, you can take steps to relieve pressure on your wrist and reduce your risk of developing this condition. How can carpal tunnel syndrome affect me? Carpal tunnel syndrome can affect your ability to do jobs or activities that involve hand, wrist, and finger action. It can cause symptoms such as: Pain in the wrist, hand, and fingers. Burning, tingling, or numbness in the affected area. A weak feeling in your hands. You may have trouble grabbing and holding items. Symptoms may get worse over time. For some people, symptoms get worse at night. What can increase my risk? The following factors may make you more likely to develop this condition: Having a job that requires you to repeatedly or forcefully bend or move your wrist, or a job that requires you to use tools that vibrate. This may include jobs that involve using computers, working on an [employer], or working with [place] such as Pension scheme manager. Being a woman. Having a family history of the condition. Having certain conditions, such as: Diabetes. Pregnancy. Obesity. Thyroid disease. Rheumatoid arthritis. What actions can I take to help prevent carpal tunnel syndrome?   Avoid making repetitive or forceful hand and wrist motions that cause your wrist to bend or get stiff or painful. Take frequent breaks,  about every 30 minutes, if you use your hands and wrists for many hours at a time. Avoid sitting for long stretches of time. Try to get up and move every 30 minutes. Stretch your hands and fingers often to increase blood flow and relieve tension. Keep your wrists in the natural position when using a computer keyboard or mouse. Do not bend your wrists downward or sideways. Arms and shoulders should be relaxed with elbows at your sides. If you use your hands and wrists for many hours at work, make changes to your work space to ease pressure on your wrists. You may want to use: A padded wrist rest for computer work. Use this to lightly rest your wrist and hands when you are not actively keying. A keyboard at a height in which your wrists are straight when typing. You may need to flatten the keyboard or even tilt it away from you. Hand tools with padded handles or work gloves with padding to reduce vibrations. Talk to your health provider about wearing a wrist brace or support. This will not prevent carpal tunnel syndrome but it may keep it from getting worse. A wrist brace may help reduce bending and stress. Closely manage any medical conditions you have that can put you at risk for carpal tunnel syndrome. Have your blood sugar checked to make sure you are not developing diabetes. If you have diabetes, work with your health care provider to keep your blood sugar under control. Physical activity and exercise may help with this condition. Some people find yoga or aerobic exercise helpful. Where to  find more information [employer] of Neurological Disorders and Stroke: [employer] of [employer]: [employer].org Contact a health care provider if: You have numbness or tingling in your wrist, hand, or fingers. You have pain or a burning sensation in your wrist, hand, or fingers. Pain, tingling, or burning wakes you up at night. Your hand becomes weak and clumsy. You  frequently drop objects. You are unable to use your wrists and hands without pain. Summary Carpal tunnel syndrome is a condition that causes pain, numbness, and weakness in the wrist, hand, and fingers. You can take steps to relieve pressure on your wrist and reduce your risk of developing this condition. Avoid making repetitive hand and wrist motions that cause your wrist to get stiff or painful. If you use your hands and wrists for many hours at work, you may want to make changes to your work space to ease pressure on your wrists. Take frequent breaks to stretch your hands and fingers. This information is not intended to replace advice given to you by your health care provider. Make sure you discuss any questions you have with your health care provider. Document Revised: 11/02/2019 Document Reviewed: 09/28/2019 Elsevier Patient Education  [place].

## 2021-06-18 ENCOUNTER — Other Ambulatory Visit: Payer: Self-pay | Admitting: Nurse Practitioner

## 2021-06-18 DIAGNOSIS — R7989 Other specified abnormal findings of blood chemistry: Secondary | ICD-10-CM

## 2021-06-18 LAB — CBC WITH DIFFERENTIAL/PLATELET
Absolute Monocytes: 621 cells/uL (ref 200–950)
Basophils Absolute: 11 cells/uL (ref 0–200)
Basophils Relative: 0.2 %
Eosinophils Absolute: 131 cells/uL (ref 15–500)
Eosinophils Relative: 2.3 %
HCT: 38.3 % (ref 35.0–45.0)
Hemoglobin: 12.8 g/dL (ref 11.7–15.5)
Lymphs Abs: 1670 cells/uL (ref 850–3900)
MCH: 30.9 pg (ref 27.0–33.0)
MCHC: 33.4 g/dL (ref 32.0–36.0)
MCV: 92.5 fL (ref 80.0–100.0)
MPV: 11.2 fL (ref 7.5–12.5)
Monocytes Relative: 10.9 %
Neutro Abs: 3266 cells/uL (ref 1500–7800)
Neutrophils Relative %: 57.3 %
Platelets: 256 10*3/uL (ref 140–400)
RBC: 4.14 10*6/uL (ref 3.80–5.10)
RDW: 11.8 % (ref 11.0–15.0)
Total Lymphocyte: 29.3 %
WBC: 5.7 10*3/uL (ref 3.8–10.8)

## 2021-06-18 LAB — LIPID PANEL
Cholesterol: 180 mg/dL (ref <200)
HDL: 59 mg/dL (ref >=50)
LDL Cholesterol (Calc): 106 mg/dL (calc) — ABNORMAL HIGH
Non-HDL Cholesterol (Calc): 121 mg/dL (calc) (ref <130)
Total CHOL/HDL Ratio: 3.1 (calc) (ref <5.0)
Triglycerides: 66 mg/dL (ref <150)

## 2021-06-18 LAB — URINALYSIS, ROUTINE W REFLEX MICROSCOPIC
Bilirubin Urine: NEGATIVE
Glucose, UA: NEGATIVE
Hgb urine dipstick: NEGATIVE
Ketones, ur: NEGATIVE
Leukocytes,Ua: NEGATIVE
Nitrite: NEGATIVE
Protein, ur: NEGATIVE
Specific Gravity, Urine: 1.015 (ref 1.001–1.035)
pH: 6 (ref 5.0–8.0)

## 2021-06-18 LAB — COMPLETE METABOLIC PANEL WITH GFR
AG Ratio: 1.5 (calc) (ref 1.0–2.5)
ALT: 13 U/L (ref 6–29)
AST: 17 U/L (ref 10–35)
Albumin: 4.5 g/dL (ref 3.6–5.1)
Alkaline phosphatase (APISO): 77 U/L (ref 37–153)
BUN: 14 mg/dL (ref 7–25)
CO2: 30 mmol/L (ref 20–32)
Calcium: 9.8 mg/dL (ref 8.6–10.4)
Chloride: 102 mmol/L (ref 98–110)
Creat: 0.75 mg/dL (ref 0.50–1.03)
Globulin: 3 g/dL (calc) (ref 1.9–3.7)
Glucose, Bld: 92 mg/dL (ref 65–99)
Potassium: 3.7 mmol/L (ref 3.5–5.3)
Sodium: 139 mmol/L (ref 135–146)
Total Bilirubin: 0.6 mg/dL (ref 0.2–1.2)
Total Protein: 7.5 g/dL (ref 6.1–8.1)
eGFR: 97 mL/min/{1.73_m2} (ref >=60)

## 2021-06-18 LAB — HEMOGLOBIN A1C
Hgb A1c MFr Bld: 5.3 % of total Hgb (ref <5.7)
Mean Plasma Glucose: 105 mg/dL
eAG (mmol/L): 5.8 mmol/L

## 2021-06-18 LAB — MAGNESIUM: Magnesium: 1.9 mg/dL (ref 1.5–2.5)

## 2021-06-18 LAB — MICROALBUMIN / CREATININE URINE RATIO
Creatinine, Urine: 93 mg/dL (ref 20–275)
Microalb Creat Ratio: 5 mcg/mg creat (ref <30)
Microalb, Ur: 0.5 mg/dL

## 2021-06-18 LAB — TSH: TSH: 4.82 mIU/L — ABNORMAL HIGH

## 2021-06-18 LAB — VITAMIN D 25 HYDROXY (VIT D DEFICIENCY, FRACTURES): Vit D, 25-Hydroxy: 39 ng/mL (ref 30–100)

## 2021-06-19 ENCOUNTER — Encounter: Payer: BC Managed Care – PPO | Admitting: Adult Health

## 2021-07-01 ENCOUNTER — Other Ambulatory Visit: Payer: Self-pay | Admitting: Nurse Practitioner

## 2021-07-01 ENCOUNTER — Telehealth: Payer: Self-pay

## 2021-07-01 DIAGNOSIS — I10 Essential (primary) hypertension: Secondary | ICD-10-CM

## 2021-07-01 MED ORDER — BISOPROLOL-HYDROCHLOROTHIAZIDE 5-6.25 MG PO TABS: 1.0000 | ORAL_TABLET | Freq: Every day | ORAL | 1 refills | Status: DC

## 2021-07-01 NOTE — Telephone Encounter (Signed)
I sent in the [place]- do not take bisoprolol or hydrochlorothiazide individual medication as Ziac is a combination of the 2 medications.

## 2021-07-01 NOTE — Telephone Encounter (Signed)
Insurance wont cover the BP med you sent in. Out of pocket is $130. She wants to go back to the original BP med if she can.

## 2021-07-02 ENCOUNTER — Telehealth: Payer: Self-pay

## 2021-07-02 ENCOUNTER — Other Ambulatory Visit: Payer: Self-pay | Admitting: Nurse Practitioner

## 2021-07-02 DIAGNOSIS — I272 Pulmonary hypertension, unspecified: Secondary | ICD-10-CM

## 2021-07-02 DIAGNOSIS — I10 Essential (primary) hypertension: Secondary | ICD-10-CM

## 2021-07-02 MED ORDER — OLMESARTAN MEDOXOMIL 20 MG PO TABS: 20.0000 mg | ORAL_TABLET | Freq: Every day | ORAL | 3 refills | Status: DC

## 2021-07-02 NOTE — Telephone Encounter (Signed)
Patient went ahead and paid for her Bisoprolol hydrochlorothiazide because she knew she needed to take her medication. She will do the Olmesartan next month.

## 2021-07-16 ENCOUNTER — Other Ambulatory Visit: Payer: BC Managed Care – PPO

## 2021-11-10 NOTE — Progress Notes (Signed)
FOLLOW UP  Assessment and Plan:   Pulmonary hypertension RVSP 45 (2010), CXR 2015 unremarkable- patient is asymptomatic, continue to monitor Declines cardiology or ECHO follow up due to cost   Hypertension Well controlled on Olmesartan 20 mg Emphasized need to monitor blood pressure at home; patient to call if consistently greater than 130/80 Continue DASH diet.   Reminder to go to the ER if any CP, SOB, nausea, dizziness, severe HA, changes vision/speech, left arm numbness and tingling and jaw pain. Check CBC, CMP/GFR  Hyperlipidemia Continue diet and exercise - Lipid panel  BMI 26 Recommended diet heavy in fruits and veggies and low in animal meats, cheeses, and dairy products, appropriate calorie intake Discussed ideal weight for height  Continue to monitor  Vitamin D Def continue supplementation; she has increased dose as recommended  Defer Vit D level to CPE   Anemia Check CBC; iron levels were resolved to baseline at last visit   Abnormal thyroid test - TSH  Medication Management Continued    Continue diet and meds as discussed. Further disposition pending results of labs. Discussed med's effects and SE's.   Over 30 minutes of exam, counseling, chart review, and critical decision making was performed.   Future Appointments  Date Time Provider [place]  06/17/2022  9:00 AM Alycia Rossetti, NP [facility] None    ----------------------------------------------------------------------------------------------------------------------  HPI 51 y.o. female  presents for 6+ month follow up on hypertension, weight and vitamin D deficiency.   BMI is Body mass index is 27.05 kg/m., she has been working on diet,  She has not been exercising but does do activity at work.  Wt Readings from Last 3 Encounters:  11/11/21 157 lb 9.6 oz (71.5 kg)  06/17/21 159 lb 12.8 oz (72.5 kg)  04/30/21 160 lb 9.6 oz (72.8 kg)   Patient was noted to have pulmonary htn on  ECHO 2010 (RVSP 45), CXR 2015 unremarkable- patient is asymptomatic. She has declined cardiology or ECHO for follow up due to cost/financial limitations.   She admits she has not been checking BPs, currently on , today their BP is BP: 124/88 . She is currently on Olmesartan 20 mg QD. BP Readings from Last 3 Encounters:  11/11/21 124/88  06/17/21 114/68  04/30/21 122/80  She is currently on Olmesartan 20 mg QD She does workout. She denies chest pain, shortness of breath, dizziness.  She is having a lot of fatigue. Last thyroid level was:  Lab Results  Component Value Date   TSH 4.82 (H) 06/17/2021    The cholesterol last visit was:   Lab Results  Component Value Date   CHOL 180 06/17/2021   HDL 59 06/17/2021   LDLCALC 106 (H) 06/17/2021   TRIG 66 06/17/2021   CHOLHDL 3.1 06/17/2021   Last A1C in the office was:  Lab Results  Component Value Date   HGBA1C 5.3 06/17/2021   Patient is on Vitamin D supplement, taking 10000 IU daily    Lab Results  Component Value Date   VD25OH 39 06/17/2021      She has persistent normocytic anemia, hx of iron def    Latest Ref Rng & Units 06/17/2021    9:34 AM 05/21/2020   10:11 AM 12/05/2019   10:05 AM  CBC  WBC 3.8 - 10.8 Thousand/uL 5.7  5.5  5.8   Hemoglobin 11.7 - 15.5 g/dL 12.8  11.4  12.3   Hematocrit 35.0 - 45.0 % 38.3  33.5  37.6   Platelets  140 - 400 Thousand/uL 256  268  216     Iron was back within normal range:  Lab Results  Component Value Date   IRON 48 05/22/2019   TIBC 375 05/22/2019   FERRITIN 25 05/22/2019     Current Medications:  Current Outpatient Medications on File Prior to Visit  Medication Sig   B Complex Vitamins (B COMPLEX PO) Take by mouth daily.   bisoprolol-hydrochlorothiazide (ZIAC) 5-6.25 MG tablet Take 1 tablet by mouth daily. for blood pressure   Cholecalciferol (VITAMIN D-3) 5000 UNITS TABS Take 2 tablets by mouth every evening. Take 10,000 IU daily   Ferrous Sulfate (IRON PO) Take by mouth  daily.   hydrochlorothiazide (HYDRODIURIL) 12.5 MG tablet Take 2 tablets (25 mg total) by mouth daily.   naproxen (NAPROSYN) 500 MG tablet Take 1 tablet (500 mg total) by mouth 2 (two) times daily as needed for moderate pain.   olmesartan (BENICAR) 20 MG tablet Take 1 tablet (20 mg total) by mouth daily.   OVER THE COUNTER MEDICATION Takes Potassium, Magnesium, Calcium once daily   No current facility-administered medications on file prior to visit.     Allergies: No Known Allergies   Medical History:  Past Medical History:  Diagnosis Date   Anemia    COVID-19 12/05/2019   Fibroids    Hypertension    Hypokalemia    Hypomagnesemia    Pulmonary hypertension ([place]) Via Echo 2010   EF 65% RVSP 45 (Dr. Stanford Breed)   Vitamin D deficiency    Family history- Reviewed and unchanged Social history- Reviewed and unchanged   Review of Systems:  Review of Systems  Constitutional:  Positive for malaise/fatigue. Negative for weight loss.  HENT:  Negative for hearing loss and tinnitus.   Eyes:  Negative for blurred vision and double vision.  Respiratory:  Negative for cough, shortness of breath and wheezing.   Cardiovascular:  Negative for chest pain, palpitations, orthopnea, claudication and leg swelling.  Gastrointestinal:  Negative for abdominal pain, blood in stool, constipation, diarrhea, heartburn, melena, nausea and vomiting.  Genitourinary: Negative.   Musculoskeletal:  Negative for joint pain and myalgias.  Skin:  Negative for rash.  Neurological:  Negative for dizziness, tingling, sensory change, weakness and headaches.  Endo/Heme/Allergies:  Negative for polydipsia.  Psychiatric/Behavioral: Negative.    All other systems reviewed and are negative.   Physical Exam: BP 124/88   Pulse 66   Temp (!) 96.8 F (36 C)   Wt 157 lb 9.6 oz (71.5 kg)   SpO2 99%   BMI 27.05 kg/m  Wt Readings from Last 3 Encounters:  11/11/21 157 lb 9.6 oz (71.5 kg)  06/17/21 159 lb 12.8 oz (72.5 kg)   04/30/21 160 lb 9.6 oz (72.8 kg)   General Appearance: Well nourished, in no apparent distress. Eyes: PERRLA, EOMs, conjunctiva no swelling or erythema Sinuses: No Frontal/maxillary tenderness ENT/Mouth: Ext aud canals clear, TMs without erythema, bulging. No erythema, swelling, or exudate on post pharynx.  Tonsils not swollen or erythematous. Hearing normal.  Neck: Supple, thyroid normal.  Respiratory: Respiratory effort normal, BS equal bilaterally without rales, rhonchi, wheezing or stridor.  Cardio: RRR with no MRGs. Brisk peripheral pulses without edema.  Abdomen: Soft, + BS.  Non tender, no guarding, rebound, hernias, masses. Lymphatics: Non tender without lymphadenopathy.  Musculoskeletal: Full ROM, 5/5 strength, Normal gait Skin: Warm, dry without rashes, lesions, ecchymosis.  Neuro: Cranial nerves intact. No cerebellar symptoms.  Psych: Awake and oriented X 3, normal affect, Insight  and Judgment appropriate.     Alycia Rossetti, NP 11:45 AM [facility]

## 2021-11-11 ENCOUNTER — Ambulatory Visit: Payer: BC Managed Care – PPO | Admitting: Nurse Practitioner

## 2021-11-11 ENCOUNTER — Encounter: Payer: Self-pay | Admitting: Nurse Practitioner

## 2021-11-11 VITALS — BP 124/88 | HR 66 | Temp 96.8°F | Wt 157.6 lb

## 2021-11-11 DIAGNOSIS — E559 Vitamin D deficiency, unspecified: Secondary | ICD-10-CM | POA: Diagnosis not present

## 2021-11-11 DIAGNOSIS — D649 Anemia, unspecified: Secondary | ICD-10-CM

## 2021-11-11 DIAGNOSIS — Z79899 Other long term (current) drug therapy: Secondary | ICD-10-CM

## 2021-11-11 DIAGNOSIS — E785 Hyperlipidemia, unspecified: Secondary | ICD-10-CM

## 2021-11-11 DIAGNOSIS — I272 Pulmonary hypertension, unspecified: Secondary | ICD-10-CM | POA: Diagnosis not present

## 2021-11-11 DIAGNOSIS — I10 Essential (primary) hypertension: Secondary | ICD-10-CM | POA: Diagnosis not present

## 2021-11-11 DIAGNOSIS — Z6826 Body mass index (BMI) 26.0-26.9, adult: Secondary | ICD-10-CM

## 2021-11-11 DIAGNOSIS — R7989 Other specified abnormal findings of blood chemistry: Secondary | ICD-10-CM

## 2021-11-11 MED ORDER — OLMESARTAN MEDOXOMIL 20 MG PO TABS: 20.0000 mg | ORAL_TABLET | Freq: Every day | ORAL | 3 refills | Status: DC

## 2021-11-12 ENCOUNTER — Other Ambulatory Visit: Payer: Self-pay | Admitting: Nurse Practitioner

## 2021-11-12 ENCOUNTER — Encounter: Payer: Self-pay | Admitting: Nurse Practitioner

## 2021-11-12 DIAGNOSIS — I10 Essential (primary) hypertension: Secondary | ICD-10-CM

## 2021-11-12 LAB — CBC WITH DIFFERENTIAL/PLATELET
Absolute Monocytes: 555 cells/uL (ref 200–950)
Basophils Absolute: 20 cells/uL (ref 0–200)
Basophils Relative: 0.4 %
Eosinophils Absolute: 150 cells/uL (ref 15–500)
Eosinophils Relative: 3 %
HCT: 39.4 % (ref 35.0–45.0)
Hemoglobin: 13.6 g/dL (ref 11.7–15.5)
Lymphs Abs: 1350 cells/uL (ref 850–3900)
MCH: 31.7 pg (ref 27.0–33.0)
MCHC: 34.5 g/dL (ref 32.0–36.0)
MCV: 91.8 fL (ref 80.0–100.0)
MPV: 11.8 fL (ref 7.5–12.5)
Monocytes Relative: 11.1 %
Neutro Abs: 2925 cells/uL (ref 1500–7800)
Neutrophils Relative %: 58.5 %
Platelets: 238 10*3/uL (ref 140–400)
RBC: 4.29 10*6/uL (ref 3.80–5.10)
RDW: 12.2 % (ref 11.0–15.0)
Total Lymphocyte: 27 %
WBC: 5 10*3/uL (ref 3.8–10.8)

## 2021-11-12 LAB — COMPLETE METABOLIC PANEL WITH GFR
AG Ratio: 1.4 (calc) (ref 1.0–2.5)
ALT: 13 U/L (ref 6–29)
AST: 15 U/L (ref 10–35)
Albumin: 4.4 g/dL (ref 3.6–5.1)
Alkaline phosphatase (APISO): 73 U/L (ref 37–153)
BUN: 17 mg/dL (ref 7–25)
CO2: 28 mmol/L (ref 20–32)
Calcium: 9.7 mg/dL (ref 8.6–10.4)
Chloride: 104 mmol/L (ref 98–110)
Creat: 0.75 mg/dL (ref 0.50–1.03)
Globulin: 3.1 g/dL (calc) (ref 1.9–3.7)
Glucose, Bld: 94 mg/dL (ref 65–99)
Potassium: 4 mmol/L (ref 3.5–5.3)
Sodium: 140 mmol/L (ref 135–146)
Total Bilirubin: 0.5 mg/dL (ref 0.2–1.2)
Total Protein: 7.5 g/dL (ref 6.1–8.1)
eGFR: 97 mL/min/{1.73_m2} (ref >=60)

## 2021-11-12 LAB — LIPID PANEL
Cholesterol: 193 mg/dL (ref <200)
HDL: 53 mg/dL (ref >=50)
LDL Cholesterol (Calc): 120 mg/dL (calc) — ABNORMAL HIGH
Non-HDL Cholesterol (Calc): 140 mg/dL (calc) — ABNORMAL HIGH (ref <130)
Total CHOL/HDL Ratio: 3.6 (calc) (ref <5.0)
Triglycerides: 98 mg/dL (ref <150)

## 2021-11-12 LAB — TSH: TSH: 0.62 mIU/L

## 2021-11-12 MED ORDER — BISOPROLOL FUMARATE 5 MG PO TABS: 5.0000 mg | ORAL_TABLET | Freq: Every day | ORAL | 3 refills | Status: DC

## 2021-11-29 ENCOUNTER — Other Ambulatory Visit: Payer: Self-pay | Admitting: Adult Health

## 2021-11-29 ENCOUNTER — Other Ambulatory Visit: Payer: Self-pay | Admitting: Internal Medicine

## 2021-11-29 DIAGNOSIS — I10 Essential (primary) hypertension: Secondary | ICD-10-CM

## 2021-11-29 MED ORDER — BISOPROLOL FUMARATE 5 MG PO TABS: 5.0000 mg | ORAL_TABLET | Freq: Every day | ORAL | 3 refills | Status: DC

## 2021-12-19 ENCOUNTER — Ambulatory Visit: Payer: BC Managed Care – PPO | Admitting: Nurse Practitioner

## 2022-05-12 NOTE — Progress Notes (Deleted)
FOLLOW UP  Assessment and Plan:   Pulmonary hypertension RVSP 45 (2010), CXR 2015 unremarkable- patient is asymptomatic, continue to monitor Declines cardiology or ECHO follow up due to cost   Hypertension Well controlled on Olmesartan 20 mg Emphasized need to monitor blood pressure at home; patient to call if consistently greater than 130/80 Continue DASH diet.   Reminder to go to the ER if any CP, SOB, nausea, dizziness, severe HA, changes vision/speech, left arm numbness and tingling and jaw pain. Check CBC, CMP/GFR  Hyperlipidemia Continue diet and exercise - Lipid panel  BMI 26 Recommended diet heavy in fruits and veggies and low in animal meats, cheeses, and dairy products, appropriate calorie intake Discussed ideal weight for height  Continue to monitor  Vitamin D Def continue supplementation; she has increased dose as recommended  Defer Vit D level to CPE   Anemia Check CBC; iron levels were resolved to baseline at last visit   Abnormal thyroid test - TSH  Medication Management Continued    Continue diet and meds as discussed. Further disposition pending results of labs. Discussed med's effects and SE's.   Over 30 minutes of exam, counseling, chart review, and critical decision making was performed.   Future Appointments  Date Time Provider [place]  05/14/2022  9:30 AM Alycia Rossetti, NP [facility] None  06/17/2022  9:00 AM Alycia Rossetti, NP [facility] None    ----------------------------------------------------------------------------------------------------------------------  HPI 51 y.o. female  presents for 6+ month follow up on hypertension, weight and vitamin D deficiency.   BMI is There is no height or weight on file to calculate BMI., she has been working on diet,  She has not been exercising but does do activity at work.  Wt Readings from Last 3 Encounters:  11/11/21 157 lb 9.6 oz (71.5 kg)  06/17/21 159 lb 12.8 oz (72.5 kg)   04/30/21 160 lb 9.6 oz (72.8 kg)   Patient was noted to have pulmonary htn on ECHO 2010 (RVSP 45), CXR 2015 unremarkable- patient is asymptomatic. She has declined cardiology or ECHO for follow up due to cost/financial limitations.   She admits she has not been checking BPs, currently on , today their BP is   . She is currently on Olmesartan 20 mg QD. BP Readings from Last 3 Encounters:  11/11/21 124/88  06/17/21 114/68  04/30/21 122/80  She is currently on Olmesartan 20 mg QD She does workout. She denies chest pain, shortness of breath, dizziness.  She is having a lot of fatigue. Last thyroid level was:  Lab Results  Component Value Date   TSH 0.62 11/11/2021    The cholesterol last visit was:   Lab Results  Component Value Date   CHOL 193 11/11/2021   HDL 53 11/11/2021   LDLCALC 120 (H) 11/11/2021   TRIG 98 11/11/2021   CHOLHDL 3.6 11/11/2021   Last A1C in the office was:  Lab Results  Component Value Date   HGBA1C 5.3 06/17/2021   Patient is on Vitamin D supplement, taking 10000 IU daily    Lab Results  Component Value Date   VD25OH 39 06/17/2021      She has persistent normocytic anemia, hx of iron def    Latest Ref Rng & Units 11/11/2021   11:58 AM 06/17/2021    9:34 AM 05/21/2020   10:11 AM  CBC  WBC 3.8 - 10.8 Thousand/uL 5.0  5.7  5.5   Hemoglobin 11.7 - 15.5 g/dL 13.6  12.8  11.4  Hematocrit 35.0 - 45.0 % 39.4  38.3  33.5   Platelets 140 - 400 Thousand/uL 238  256  268     Iron was back within normal range:  Lab Results  Component Value Date   IRON 48 05/22/2019   TIBC 375 05/22/2019   FERRITIN 25 05/22/2019     Current Medications:  Current Outpatient Medications on File Prior to Visit  Medication Sig   B Complex Vitamins (B COMPLEX PO) Take by mouth daily.   bisoprolol (ZEBETA) 5 MG tablet Take 1 tablet (5 mg total) by mouth daily.   bisoprolol-hydrochlorothiazide (ZIAC) 5-6.25 MG tablet TAKE ONE TABLET BY MOUTH ONE TIME DAILY FOR BLOOD  PRESSURE   Cholecalciferol (VITAMIN D-3) 5000 UNITS TABS Take 2 tablets by mouth every evening. Take 10,000 IU daily   Ferrous Sulfate (IRON PO) Take by mouth daily.   naproxen (NAPROSYN) 500 MG tablet Take 1 tablet (500 mg total) by mouth 2 (two) times daily as needed for moderate pain.   OVER THE COUNTER MEDICATION Takes Potassium, Magnesium, Calcium once daily   No current facility-administered medications on file prior to visit.     Allergies: No Known Allergies   Medical History:  Past Medical History:  Diagnosis Date   Anemia    COVID-19 12/05/2019   Fibroids    Hypertension    Hypokalemia    Hypomagnesemia    Pulmonary hypertension ([place]) Via Echo 2010   EF 65% RVSP 45 (Dr. Stanford Breed)   Vitamin D deficiency    Family history- Reviewed and unchanged Social history- Reviewed and unchanged   Review of Systems:  Review of Systems  Constitutional:  Positive for malaise/fatigue. Negative for weight loss.  HENT:  Negative for hearing loss and tinnitus.   Eyes:  Negative for blurred vision and double vision.  Respiratory:  Negative for cough, shortness of breath and wheezing.   Cardiovascular:  Negative for chest pain, palpitations, orthopnea, claudication and leg swelling.  Gastrointestinal:  Negative for abdominal pain, blood in stool, constipation, diarrhea, heartburn, melena, nausea and vomiting.  Genitourinary: Negative.   Musculoskeletal:  Negative for joint pain and myalgias.  Skin:  Negative for rash.  Neurological:  Negative for dizziness, tingling, sensory change, weakness and headaches.  Endo/Heme/Allergies:  Negative for polydipsia.  Psychiatric/Behavioral: Negative.    All other systems reviewed and are negative.   Physical Exam: There were no vitals taken for this visit. Wt Readings from Last 3 Encounters:  11/11/21 157 lb 9.6 oz (71.5 kg)  06/17/21 159 lb 12.8 oz (72.5 kg)  04/30/21 160 lb 9.6 oz (72.8 kg)   General Appearance: Well nourished, in no  apparent distress. Eyes: PERRLA, EOMs, conjunctiva no swelling or erythema Sinuses: No Frontal/maxillary tenderness ENT/Mouth: Ext aud canals clear, TMs without erythema, bulging. No erythema, swelling, or exudate on post pharynx.  Tonsils not swollen or erythematous. Hearing normal.  Neck: Supple, thyroid normal.  Respiratory: Respiratory effort normal, BS equal bilaterally without rales, rhonchi, wheezing or stridor.  Cardio: RRR with no MRGs. Brisk peripheral pulses without edema.  Abdomen: Soft, + BS.  Non tender, no guarding, rebound, hernias, masses. Lymphatics: Non tender without lymphadenopathy.  Musculoskeletal: Full ROM, 5/5 strength, Normal gait Skin: Warm, dry without rashes, lesions, ecchymosis.  Neuro: Cranial nerves intact. No cerebellar symptoms.  Psych: Awake and oriented X 3, normal affect, Insight and Judgment appropriate.     Alycia Rossetti, NP 12:58 PM [facility]

## 2022-05-14 ENCOUNTER — Ambulatory Visit: Payer: BC Managed Care – PPO | Admitting: Nurse Practitioner

## 2022-05-14 DIAGNOSIS — E785 Hyperlipidemia, unspecified: Secondary | ICD-10-CM

## 2022-05-14 DIAGNOSIS — I10 Essential (primary) hypertension: Secondary | ICD-10-CM

## 2022-05-14 DIAGNOSIS — D509 Iron deficiency anemia, unspecified: Secondary | ICD-10-CM

## 2022-05-14 DIAGNOSIS — E559 Vitamin D deficiency, unspecified: Secondary | ICD-10-CM

## 2022-05-14 DIAGNOSIS — R7989 Other specified abnormal findings of blood chemistry: Secondary | ICD-10-CM

## 2022-05-14 DIAGNOSIS — Z79899 Other long term (current) drug therapy: Secondary | ICD-10-CM

## 2022-05-14 DIAGNOSIS — I272 Pulmonary hypertension, unspecified: Secondary | ICD-10-CM

## 2022-06-02 ENCOUNTER — Other Ambulatory Visit: Payer: Self-pay | Admitting: Nurse Practitioner

## 2022-06-17 ENCOUNTER — Encounter: Payer: BC Managed Care – PPO | Admitting: Nurse Practitioner

## 2022-06-30 ENCOUNTER — Encounter: Payer: BC Managed Care – PPO | Admitting: Nurse Practitioner

## 2022-07-13 ENCOUNTER — Encounter: Payer: BC Managed Care – PPO | Admitting: Nurse Practitioner

## 2022-07-27 ENCOUNTER — Ambulatory Visit (INDEPENDENT_AMBULATORY_CARE_PROVIDER_SITE_OTHER): Payer: BC Managed Care – PPO | Admitting: Nurse Practitioner

## 2022-07-27 ENCOUNTER — Encounter: Payer: Self-pay | Admitting: Nurse Practitioner

## 2022-07-27 VITALS — BP 138/72 | HR 70 | Temp 97.7°F | Ht 64.0 in | Wt 165.4 lb

## 2022-07-27 DIAGNOSIS — Z130 Encounter for screening for diseases of the blood and blood-forming organs and certain disorders involving the immune mechanism: Secondary | ICD-10-CM | POA: Diagnosis not present

## 2022-07-27 DIAGNOSIS — I272 Pulmonary hypertension, unspecified: Secondary | ICD-10-CM | POA: Diagnosis not present

## 2022-07-27 DIAGNOSIS — Z0001 Encounter for general adult medical examination with abnormal findings: Secondary | ICD-10-CM

## 2022-07-27 DIAGNOSIS — Z13220 Encounter for screening for lipoid disorders: Secondary | ICD-10-CM

## 2022-07-27 DIAGNOSIS — Z0000 Encounter for general adult medical examination without abnormal findings: Secondary | ICD-10-CM | POA: Diagnosis not present

## 2022-07-27 DIAGNOSIS — Z1231 Encounter for screening mammogram for malignant neoplasm of breast: Secondary | ICD-10-CM

## 2022-07-27 DIAGNOSIS — D509 Iron deficiency anemia, unspecified: Secondary | ICD-10-CM

## 2022-07-27 DIAGNOSIS — Z1329 Encounter for screening for other suspected endocrine disorder: Secondary | ICD-10-CM

## 2022-07-27 DIAGNOSIS — Z136 Encounter for screening for cardiovascular disorders: Secondary | ICD-10-CM | POA: Diagnosis not present

## 2022-07-27 DIAGNOSIS — G5602 Carpal tunnel syndrome, left upper limb: Secondary | ICD-10-CM

## 2022-07-27 DIAGNOSIS — Z79899 Other long term (current) drug therapy: Secondary | ICD-10-CM | POA: Diagnosis not present

## 2022-07-27 DIAGNOSIS — Z1389 Encounter for screening for other disorder: Secondary | ICD-10-CM

## 2022-07-27 DIAGNOSIS — Z131 Encounter for screening for diabetes mellitus: Secondary | ICD-10-CM | POA: Diagnosis not present

## 2022-07-27 DIAGNOSIS — I10 Essential (primary) hypertension: Secondary | ICD-10-CM

## 2022-07-27 DIAGNOSIS — Z1211 Encounter for screening for malignant neoplasm of colon: Secondary | ICD-10-CM

## 2022-07-27 DIAGNOSIS — Z23 Encounter for immunization: Secondary | ICD-10-CM | POA: Diagnosis not present

## 2022-07-27 DIAGNOSIS — E559 Vitamin D deficiency, unspecified: Secondary | ICD-10-CM

## 2022-07-27 DIAGNOSIS — E785 Hyperlipidemia, unspecified: Secondary | ICD-10-CM

## 2022-07-27 DIAGNOSIS — D259 Leiomyoma of uterus, unspecified: Secondary | ICD-10-CM

## 2022-07-27 DIAGNOSIS — E663 Overweight: Secondary | ICD-10-CM

## 2022-07-27 NOTE — Addendum Note (Signed)
Addended by: Chancy Hurter on: 07/27/2022 10:31 AM   Modules accepted: Orders

## 2022-07-27 NOTE — Progress Notes (Signed)
Complete Physical  Assessment and Plan:  Dawn Pope was seen today for annual exam.  Diagnoses and all orders for this visit:  Encounter for routine adult health examination without abnormal findings Due annually Health maintenance reviewed  Pulmonary hypertension ([place]) Patient declines further work up at this time Discussed risked associated with pulmonary HTN Monitor for increase SOB, dizziness, fatigue, BLE edema, Monitor weight Continue to monitor  Essential hypertension Continue Bisoprolol/HCTZ Discussed DASH (Dietary Approaches to Stop Hypertension) DASH diet is lower in sodium than a typical American diet. Cut back on foods that are high in saturated fat, cholesterol, and trans fats. Eat more whole-grain foods, fish, poultry, and nuts Remain active and exercise as tolerated daily.  Monitor BP at home-Call if greater than 130/80.  Check CMP/CBC  Uterine leiomyoma, unspecified location Following with GYN - plans to schedule Due for PAP   Vitamin D deficiency Continue supplement Monitor levels  Iron deficiency anemia, unspecified iron deficiency anemia type Continue Ferrous Sulfate Monitor CBC/Anemia panel  Hyperlipidemia Discussed lifestyle modifications. Recommended diet heavy in fruits and veggies, omega 3's. Decrease consumption of animal meats, cheeses, and dairy products. Remain active and exercise as tolerated. Continue to monitor. Check lipids/TSH   Overweight BMI 27.0-27.9 Discussed appropriate BMI Diet modification. Physical activity. Encouraged/praised to build confidence.  Screening for thyroid disorder/thyroid nodule Had repeat U/S 06/11/20 stable no follow up recommended Monitor TSH  Screening for diabetes mellitus -     Hemoglobin A1c    Carpal tunnel of left wrist Resolved Continue Naproxen  Discussed exercises;continue support brace Monitor symptoms and if worsen send to orthopedics  Screening for hematuria or  proteinuria -UA/Microalbumin  Medication management All medications discussed and reviewed in full. All questions and concerns regarding medications addressed.    Screening for breast cancer Mammogram ordered  Screening for colon cancer Patient has cologuard at home to complete  Orders Placed This Encounter  Procedures   MM Digital Screening    Standing Status:   Future    Standing Expiration Date:   07/28/2023    Order Specific Question:   Reason for Exam (SYMPTOM  OR DIAGNOSIS REQUIRED)    Answer:   Annual screening    Order Specific Question:   Is the patient pregnant?    Answer:   No    Order Specific Question:   Preferred imaging location?    Answer:   [facility]   CBC with Differential/Platelet   COMPLETE METABOLIC PANEL WITH GFR   Magnesium   Lipid panel   TSH   Hemoglobin A1c   Insulin, random   VITAMIN D 25 Hydroxy (Vit-D Deficiency, Fractures)   Urinalysis, Routine w reflex microscopic   Microalbumin / creatinine urine ratio   Iron, TIBC and Ferritin Panel   EKG 12-Lead    Notify office for further evaluation and treatment, questions or concerns if any reported s/s fail to improve.   The patient was advised to call back or seek an in-person evaluation if any symptoms worsen or if the condition fails to improve as anticipated.   Further disposition pending results of labs. Discussed med's effects and SE's.    I discussed the assessment and treatment plan with the patient. The patient was provided an opportunity to ask questions and all were answered. The patient agreed with the plan and demonstrated an understanding of the instructions.  Discussed med's effects and SE's. Screening labs and tests as requested with regular follow-up as recommended.  I provided 40 minutes of face-to-face time during  this encounter including counseling, chart review, and critical decision making was preformed.    Future Appointments  Date Time Provider [place]   07/28/2023  9:00 AM Darrol Jump, NP [facility] None     HPI  52 y.o. female  presents for a complete physical and follow up for has Hypertension; Vitamin D deficiency; Fibroid, uterine; Overweight (BMI 25.0-29.9); Multinodular thyroid; Hyperlipidemia; and Anemia on their problem list.  Overall she reports feeling well today.  She has no new concerns in clinic.   She is single, 2 kids 80 and 21, no grand kids. Single and happy. 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 Does a lot of volunteering with [employer].   She has a known uterine fibroid, underwent uterine artery embolization in 2015, sx improved since then, continues to follow with GYN - [facility]. Last Pap 2 years ago. She plans to follow up.    She has menstrual cycles, states they can occasionally be heavy but are not as bad as they used to be, has been on iron supplement in the last year, taking every other day only, reports gave blood last week.  Still continuing to have regular cycles.  Denies any s/s of menopause.   BMI is Body mass index is 28.39 kg/m., she has not been working on diet Wt Readings from Last 3 Encounters:  07/27/22 165 lb 6.4 oz (75 kg)  11/11/21 157 lb 9.6 oz (71.5 kg)  06/17/21 159 lb 12.8 oz (72.5 kg)   Patient also has documented history of pulmonary hypertension apparently noted on an ECHO in 2010- patient is unaware of this diagnosis, but denies exertional dyspnea, lethargy, fatigue, snoring, AM headaches. She declines follow up on this.   Her blood pressure has been controlled at home, today - she continues bisoprolol fumarate - their BP is BP: 138/72  BP Readings from Last 3 Encounters:  07/27/22 138/72  11/11/21 124/88  06/17/21 114/68    She does not workout but works physically intense job. She denies chest pain, shortness of breath, dizziness.   She is not on cholesterol medication and denies myalgias. Her cholesterol is not at goal. The  cholesterol last visit was:   Lab Results  Component Value Date   CHOL 193 11/11/2021   HDL 53 11/11/2021   LDLCALC 120 (H) 11/11/2021   TRIG 98 11/11/2021   CHOLHDL 3.6 11/11/2021   She has been working on diet and exercise for glucose management, and denies increased appetite, nausea, paresthesia of the feet, polydipsia, polyuria and visual disturbances. Last A1C in the office was:  Lab Results  Component Value Date   HGBA1C 5.3 06/17/2021   Last GFR: Lab Results  Component Value Date   GFRAA 107 05/21/2020   Patient is on Vitamin D supplement, she takes 10000 daily.  Lab Results  Component Value Date   VD25OH 39 06/17/2021      Current Medications:  Current Outpatient Medications on File Prior to Visit  Medication Sig Dispense Refill   bisoprolol (ZEBETA) 5 MG tablet Take 1 tablet (5 mg total) by mouth daily. 90 tablet 3   Cholecalciferol (VITAMIN D-3) 5000 UNITS TABS Take 2 tablets by mouth every evening. Take 10,000 IU daily     Ferrous Sulfate (IRON PO) Take by mouth daily.     OVER THE COUNTER MEDICATION Takes Potassium, Magnesium, Calcium once daily     B Complex Vitamins (B COMPLEX PO) Take by mouth daily. (Patient not taking:  Reported on 07/27/2022)     bisoprolol-hydrochlorothiazide (ZIAC) 5-6.25 MG tablet TAKE ONE TABLET BY MOUTH TIME DAILY FOR BLOOD PRESSURE (Patient not taking: Reported on 07/27/2022) 90 tablet 0   naproxen (NAPROSYN) 500 MG tablet Take 1 tablet (500 mg total) by mouth 2 (two) times daily as needed for moderate pain. (Patient not taking: Reported on 07/27/2022) 15 tablet 0   No current facility-administered medications on file prior to visit.   Allergies:  No Known Allergies Medical History:  She has Hypertension; Vitamin D deficiency; Fibroid, uterine; Overweight (BMI 25.0-29.9); Multinodular thyroid; Hyperlipidemia; and Anemia on their problem list. Health Maintenance:   Immunization History  Administered Date(s) Administered    Influenza,inj,Quad PF,6+ Mos 04/30/2021   Janssen (J&J) SARS-COV-2 Vaccination 09/01/2019   Pneumococcal-Unspecified 06/14/2001   Td 06/14/2001   Tdap 06/15/2013   Tetanus: 2015 Pneumovax: 2003 Flu vaccine: Due ** Zostavax: N/A covid 19: J&J 2021, will get booster   Pap: Reports 2018 OB/GYN, will call to schedule MGM: Ordered  DEXA: N/A  Colonoscopy: DUE, never scheduled willing to do cologuard  EGD: N/A Echo: 2010 PHTN 45, EF 65%  Vision- plans to schedule  Dentist- Dr. Arrie Aran, last 2023, needs to schedule   Patient Care Team: Unk Pinto, MD as PCP - General (Internal Medicine)  Surgical History:  She has a past surgical history that includes Uterine artery embolization (04/27/2014) and ir generic historical (07/04/2014). Family History:  Herfamily history includes Heart disease in her paternal grandmother; Hyperlipidemia in her father and mother; Hypertension in her father and mother; Stroke (age of onset: 31) in her father. Social History:  She reports that she has never smoked. She has never used smokeless tobacco. She reports that she does not drink alcohol and does not use drugs.  Review of Systems: Review of Systems  Constitutional:  Negative for malaise/fatigue and weight loss.  HENT:  Negative for hearing loss and tinnitus.   Eyes:  Negative for blurred vision and double vision.  Respiratory:  Negative for cough, shortness of breath and wheezing.   Cardiovascular:  Negative for chest pain, palpitations, orthopnea, claudication and leg swelling.  Gastrointestinal:  Negative for abdominal pain, blood in stool, constipation, diarrhea, heartburn, melena, nausea and vomiting.  Genitourinary: Negative.   Musculoskeletal:  Negative for joint pain and myalgias.  Skin:  Negative for rash.  Neurological:  Positive for tingling (left thumb and first 2 fingers). Negative for dizziness, sensory change, weakness and headaches.  Endo/Heme/Allergies:  Negative for polydipsia.   Psychiatric/Behavioral: Negative.    All other systems reviewed and are negative.   Physical Exam: Estimated body mass index is 28.39 kg/m as calculated from the following:   Height as of this encounter: '5\' 4"'$  (1.626 m).   Weight as of this encounter: 165 lb 6.4 oz (75 kg). BP 138/72   Pulse 70   Temp 97.7 F (36.5 C)   Ht '5\' 4"'$  (1.626 m)   Wt 165 lb 6.4 oz (75 kg)   SpO2 99%   BMI 28.39 kg/m  General Appearance: Well nourished, in no apparent distress.  Eyes: PERRLA, EOMs, conjunctiva no swelling or erythema Sinuses: No Frontal/maxillary tenderness  ENT/Mouth: Ext aud canals clear, normal light reflex with TMs without erythema, bulging. Good dentition. No erythema, swelling, or exudate on post pharynx. Tonsils not swollen or erythematous. Hearing normal.  Neck: Supple, approx 0.5 cm smooth nodule to R side of thyroid. No bruits  Respiratory: Respiratory effort normal, BS equal bilaterally without rales, rhonchi, wheezing or stridor.  Cardio: RRR without murmurs, rubs or gallops. Brisk peripheral pulses without edema.  Chest: symmetric, with normal excursions and percussion.  Breasts: breasts appear normal, no suspicious masses, no skin or nipple changes or axillary nodes. Abdomen: Soft, nontender, no guarding, rebound, hernias, masses, or organomegaly.  Lymphatics: Non tender without lymphadenopathy.  Genitourinary: Defer to GYN Musculoskeletal: Full ROM all peripheral extremities,5/5 strength, and normal gait.  Skin: Warm, dry without rashes, lesions, ecchymosis. Neuro: Cranial nerves intact, reflexes equal bilaterally. Normal muscle tone, no cerebellar symptoms. Sensation intact.  Psych: Awake and oriented X 3, normal affect, Insight and Judgment appropriate.   EKG: NSR, no ST changes  Nastasha Nolan Tuazon 9:52 AM [facility]

## 2022-07-27 NOTE — Patient Instructions (Signed)

## 2022-07-28 LAB — CBC WITH DIFFERENTIAL/PLATELET
Absolute Monocytes: 570 cells/uL (ref 200–950)
Basophils Absolute: 11 cells/uL (ref 0–200)
Basophils Relative: 0.2 %
Eosinophils Absolute: 97 cells/uL (ref 15–500)
Eosinophils Relative: 1.7 %
HCT: 39.6 % (ref 35.0–45.0)
Hemoglobin: 13.6 g/dL (ref 11.7–15.5)
Lymphs Abs: 1248 cells/uL (ref 850–3900)
MCH: 30.8 pg (ref 27.0–33.0)
MCHC: 34.3 g/dL (ref 32.0–36.0)
MCV: 89.6 fL (ref 80.0–100.0)
MPV: 11.3 fL (ref 7.5–12.5)
Monocytes Relative: 10 %
Neutro Abs: 3773 cells/uL (ref 1500–7800)
Neutrophils Relative %: 66.2 %
Platelets: 251 10*3/uL (ref 140–400)
RBC: 4.42 10*6/uL (ref 3.80–5.10)
RDW: 11.6 % (ref 11.0–15.0)
Total Lymphocyte: 21.9 %
WBC: 5.7 10*3/uL (ref 3.8–10.8)

## 2022-07-28 LAB — IRON,TIBC AND FERRITIN PANEL
%SAT: 19 % (calc) (ref 16–45)
Ferritin: 57 ng/mL (ref 16–232)
Iron: 75 ug/dL (ref 45–160)
TIBC: 387 mcg/dL (calc) (ref 250–450)

## 2022-07-28 LAB — TSH: TSH: 1.07 mIU/L

## 2022-07-28 LAB — URINALYSIS, ROUTINE W REFLEX MICROSCOPIC
Bilirubin Urine: NEGATIVE
Glucose, UA: NEGATIVE
Hgb urine dipstick: NEGATIVE
Ketones, ur: NEGATIVE
Leukocytes,Ua: NEGATIVE
Nitrite: NEGATIVE
Protein, ur: NEGATIVE
Specific Gravity, Urine: 1.028 (ref 1.001–1.035)
pH: 5.5 (ref 5.0–8.0)

## 2022-07-28 LAB — COMPLETE METABOLIC PANEL WITH GFR
AG Ratio: 1.6 (calc) (ref 1.0–2.5)
ALT: 18 U/L (ref 6–29)
AST: 18 U/L (ref 10–35)
Albumin: 4.6 g/dL (ref 3.6–5.1)
Alkaline phosphatase (APISO): 77 U/L (ref 37–153)
BUN: 15 mg/dL (ref 7–25)
CO2: 30 mmol/L (ref 20–32)
Calcium: 9.8 mg/dL (ref 8.6–10.4)
Chloride: 100 mmol/L (ref 98–110)
Creat: 0.67 mg/dL (ref 0.50–1.03)
Globulin: 2.9 g/dL (calc) (ref 1.9–3.7)
Glucose, Bld: 95 mg/dL (ref 65–99)
Potassium: 4 mmol/L (ref 3.5–5.3)
Sodium: 137 mmol/L (ref 135–146)
Total Bilirubin: 0.6 mg/dL (ref 0.2–1.2)
Total Protein: 7.5 g/dL (ref 6.1–8.1)
eGFR: 106 mL/min/{1.73_m2} (ref >=60)

## 2022-07-28 LAB — LIPID PANEL
Cholesterol: 206 mg/dL — ABNORMAL HIGH (ref <200)
HDL: 65 mg/dL (ref >=50)
LDL Cholesterol (Calc): 125 mg/dL (calc) — ABNORMAL HIGH
Non-HDL Cholesterol (Calc): 141 mg/dL (calc) — ABNORMAL HIGH (ref <130)
Total CHOL/HDL Ratio: 3.2 (calc) (ref <5.0)
Triglycerides: 65 mg/dL (ref <150)

## 2022-07-28 LAB — HEMOGLOBIN A1C
Hgb A1c MFr Bld: 5.8 % of total Hgb — ABNORMAL HIGH (ref <5.7)
Mean Plasma Glucose: 120 mg/dL
eAG (mmol/L): 6.6 mmol/L

## 2022-07-28 LAB — MAGNESIUM: Magnesium: 1.8 mg/dL (ref 1.5–2.5)

## 2022-07-28 LAB — VITAMIN D 25 HYDROXY (VIT D DEFICIENCY, FRACTURES): Vit D, 25-Hydroxy: 56 ng/mL (ref 30–100)

## 2022-07-28 LAB — MICROALBUMIN / CREATININE URINE RATIO
Creatinine, Urine: 219 mg/dL (ref 20–275)
Microalb Creat Ratio: 6 mcg/mg creat (ref <30)
Microalb, Ur: 1.4 mg/dL

## 2022-07-28 LAB — INSULIN, RANDOM: Insulin: 8.6 u[IU]/mL

## 2022-09-07 ENCOUNTER — Other Ambulatory Visit: Payer: Self-pay | Admitting: Nurse Practitioner

## 2022-09-08 ENCOUNTER — Other Ambulatory Visit: Payer: Self-pay | Admitting: Nurse Practitioner

## 2022-09-11 ENCOUNTER — Other Ambulatory Visit: Payer: Self-pay | Admitting: Internal Medicine

## 2022-09-11 DIAGNOSIS — I10 Essential (primary) hypertension: Secondary | ICD-10-CM

## 2022-09-11 MED ORDER — BISOPROLOL FUMARATE 5 MG PO TABS: 5.0000 mg | ORAL_TABLET | Freq: Every day | ORAL | 3 refills | Status: DC

## 2022-12-15 ENCOUNTER — Ambulatory Visit: Payer: BC Managed Care – PPO | Admitting: Nurse Practitioner

## 2022-12-15 ENCOUNTER — Encounter: Payer: Self-pay | Admitting: Nurse Practitioner

## 2022-12-15 VITALS — BP 202/130 | HR 76 | Temp 97.6°F | Ht 64.0 in | Wt 165.6 lb

## 2022-12-15 DIAGNOSIS — E785 Hyperlipidemia, unspecified: Secondary | ICD-10-CM

## 2022-12-15 DIAGNOSIS — I272 Pulmonary hypertension, unspecified: Secondary | ICD-10-CM

## 2022-12-15 DIAGNOSIS — Z79899 Other long term (current) drug therapy: Secondary | ICD-10-CM

## 2022-12-15 DIAGNOSIS — E663 Overweight: Secondary | ICD-10-CM

## 2022-12-15 DIAGNOSIS — I10 Essential (primary) hypertension: Secondary | ICD-10-CM

## 2022-12-15 MED ORDER — BISOPROLOL-HYDROCHLOROTHIAZIDE 5-6.25 MG PO TABS: ORAL_TABLET | ORAL | 1 refills | Status: DC

## 2022-12-15 NOTE — Progress Notes (Unsigned)
Assessment and Plan:  Dawn Pope was seen today for an episodic visit.  Diagnoses and all order for this visit:  Essential hypertension Restart Bisoprolol HCTZ Discussed DASH (Dietary Approaches to Stop Hypertension) DASH diet is lower in sodium than a typical American diet. Cut back on foods that are high in saturated fat, cholesterol, and trans fats. Eat more whole-grain foods, fish, poultry, and nuts Remain active and exercise as tolerated daily.  Monitor BP at home-Call if greater than 130/80.  Report to ER for any increase in stroke like symptoms, including HA, N/V, paralysis, difficulty speaking, trouble walking, confusion, vision changes, CP, heart palpitations, SOB, diaphoresis.  - bisoprolol-hydrochlorothiazide (ZIAC) 5-6.25 MG tablet; Start by taking 1 tab daily for BP goal <130/80.  Dispense: 90 tablet; Refill: 1  Pulmonary hypertension (HCC)  - bisoprolol-hydrochlorothiazide (ZIAC) 5-6.25 MG tablet; Start by taking 1 tab daily for BP goal <130/80.  Dispense: 90 tablet; Refill: 1  Hyperlipidemia, unspecified hyperlipidemia type Discussed lifestyle modifications. Recommended diet heavy in fruits and veggies, omega 3's. Decrease consumption of animal meats, cheeses, and dairy products. Remain active and exercise as tolerated. Continue to monitor.  Overweight (BMI 25.0-29.9) Discussed appropriate BMI Diet modification. Physical activity. Encouraged/praised to build confidence.  Medication management All medications discussed and reviewed in full. All questions and concerns regarding medications addressed.    - bisoprolol-hydrochlorothiazide (ZIAC) 5-6.25 MG tablet; Start by taking 1 tab daily for BP goal <130/80.  Dispense: 90 tablet; Refill: 1  Notify office for further evaluation and treatment, questions or concerns if s/s fail to improve. The risks and benefits of my recommendations, as well as other treatment options were discussed with the patient today.  Questions were answered.  Further disposition pending results of labs. Discussed med's effects and SE's.    Over 15 minutes of exam, counseling, chart review, and critical decision making was performed.   Future Appointments  Date Time Provider Department Center  01/25/2023  9:30 AM Dawn Glimpse, NP [facility] None  07/28/2023  9:00 AM Dawn Pope, Archie Patten, NP [facility] None    ------------------------------------------------------------------------------------------------------------------   HPI BP (!) 202/130   Pulse 76   Temp 97.6 F (36.4 C)   Ht 5\' 4"  (1.626 m)   Wt 165 lb 9.6 oz (75.1 kg)   SpO2 99%   BMI 28.43 kg/m   52 y.o.female presents for evaluation of elevated BP after trying to donate blood yesterday.  States that she was taking Bisoprolol 5 mg daily but did not check her BP in home.  She has been out of the medication.  She noticed mild edema in RLE with mild HA.  No vision changes.  She denies N/V, paralysis, difficulty speaking, trouble walking, confusion, vision changes, CP, heart palpitations, SOB, diaphoresis.  Past Medical History:  Diagnosis Date   Anemia    COVID-19 12/05/2019   Fibroids    Hypertension    Hypokalemia    Hypomagnesemia    Pulmonary hypertension (HCC) Via Echo 2010   EF 65% RVSP 45 (Dr. Jens Som)   Vitamin D deficiency      No Known Allergies  Current Outpatient Medications on File Prior to Visit  Medication Sig   bisoprolol (ZEBETA) 5 MG tablet Take 1 tablet (5 mg total) by mouth daily.   Cholecalciferol (VITAMIN D-3) 5000 UNITS TABS Take 2 tablets by mouth every evening. Take 10,000 IU daily   Cyanocobalamin (B-12 PO) Take by mouth daily.   Ferrous Sulfate (IRON PO) Take by mouth daily.   OVER THE  COUNTER MEDICATION Takes Potassium, Magnesium, Calcium once daily   No current facility-administered medications on file prior to visit.    ROS: all negative except what is noted in the HPI.   Physical Exam:  BP (!) 202/130    Pulse 76   Temp 97.6 F (36.4 C)   Ht 5\' 4"  (1.626 m)   Wt 165 lb 9.6 oz (75.1 kg)   SpO2 99%   BMI 28.43 kg/m   General Appearance: NAD.  Awake, conversant and cooperative. Eyes: PERRLA, EOMs intact.  Sclera white.  Conjunctiva without erythema. Sinuses: No frontal/maxillary tenderness.  No nasal discharge. Nares patent.  ENT/Mouth: Ext aud canals clear.  Bilateral TMs w/DOL and without erythema or bulging. Hearing intact.  Posterior pharynx without swelling or exudate.  Tonsils without swelling or erythema.  Neck: Supple.  No masses, nodules or thyromegaly. Respiratory: Effort is regular with non-labored breathing. Breath sounds are equal bilaterally without rales, rhonchi, wheezing or stridor.  Cardio: RRR with no MRGs. Brisk peripheral pulses without edema.  Abdomen: Active BS in all four quadrants.  Soft and non-tender without guarding, rebound tenderness, hernias or masses. Lymphatics: Non tender without lymphadenopathy.  Musculoskeletal: Full ROM, 5/5 strength, normal ambulation.  No clubbing or cyanosis. Skin: Appropriate color for ethnicity. Warm without rashes, lesions, ecchymosis, ulcers.  Neuro: CN II-XII grossly normal. Normal muscle tone without cerebellar symptoms and intact sensation.   Psych: AO X 3,  appropriate mood and affect, insight and judgment.     Dawn Glimpse, NP 4:37 PM [facility]

## 2022-12-15 NOTE — Patient Instructions (Signed)

## 2023-01-25 ENCOUNTER — Encounter: Payer: Self-pay | Admitting: Nurse Practitioner

## 2023-01-25 ENCOUNTER — Ambulatory Visit: Payer: BC Managed Care – PPO | Admitting: Nurse Practitioner

## 2023-01-25 VITALS — BP 158/94 | HR 73 | Temp 97.8°F | Ht 64.0 in | Wt 165.2 lb

## 2023-01-25 DIAGNOSIS — E785 Hyperlipidemia, unspecified: Secondary | ICD-10-CM | POA: Diagnosis not present

## 2023-01-25 DIAGNOSIS — I10 Essential (primary) hypertension: Secondary | ICD-10-CM | POA: Diagnosis not present

## 2023-01-25 DIAGNOSIS — D509 Iron deficiency anemia, unspecified: Secondary | ICD-10-CM

## 2023-01-25 DIAGNOSIS — I272 Pulmonary hypertension, unspecified: Secondary | ICD-10-CM

## 2023-01-25 DIAGNOSIS — D259 Leiomyoma of uterus, unspecified: Secondary | ICD-10-CM

## 2023-01-25 DIAGNOSIS — R7303 Prediabetes: Secondary | ICD-10-CM | POA: Diagnosis not present

## 2023-01-25 DIAGNOSIS — E559 Vitamin D deficiency, unspecified: Secondary | ICD-10-CM

## 2023-01-25 DIAGNOSIS — Z79899 Other long term (current) drug therapy: Secondary | ICD-10-CM

## 2023-01-25 DIAGNOSIS — E663 Overweight: Secondary | ICD-10-CM

## 2023-01-25 MED ORDER — VALSARTAN 40 MG PO TABS: 40.0000 mg | ORAL_TABLET | Freq: Every day | ORAL | 11 refills | Status: DC

## 2023-01-25 NOTE — Progress Notes (Signed)
Assessment and Plan:  Dawn Pope was seen today for an episodic visit.  Diagnoses and all order for this visit:  Essential hypertension/Pulmonary HTN Continue Bisoprolol hydrochlorothiazide Daily Monitor BP for 1 week and if >130/80 discussed adding Valsartan Discussed DASH (Dietary Approaches to Stop Hypertension) DASH diet is lower in sodium than a typical American diet. Cut back on foods that are high in saturated fat, cholesterol, and trans fats. Eat more whole-grain foods, fish, poultry, and nuts Remain active and exercise as tolerated daily.  Report to ER for any increase in stroke like symptoms, including HA, N/V, paralysis, difficulty speaking, trouble walking, confusion, vision changes, CP, heart palpitations, SOB, diaphoresis.  Hyperlipidemia, unspecified hyperlipidemia type Discussed lifestyle modifications. Recommended diet heavy in fruits and veggies, omega 3's. Decrease consumption of animal meats, cheeses, and dairy products. Remain active and exercise as tolerated. Continue to monitor.  Overweight (BMI 25.0-29.9) Discussed appropriate BMI Diet modification. Physical activity. Encouraged/praised to build confidence.  Medication management All medications discussed and reviewed in full. All questions and concerns regarding medications addressed.    Uterine leiomyoma, unspecified location Following with GYN - plans to schedule Due for PAP    Vitamin D deficiency Continue supplement Monitor levels   Iron deficiency anemia, unspecified iron deficiency anemia type Continue Ferrous Sulfate Monitor CBC/Anemia panel  Prediabetes Education: Reviewed 'ABCs' of diabetes management  Discussed goals to be met and/or maintained include A1C (<7) Blood pressure (<130/80) Cholesterol (LDL <70) Continue Eye Exam yearly  Continue Dental Exam Q6 mo Discussed dietary recommendations Discussed Physical Activity recommendations Check A1C  Orders Placed This  Encounter  Procedures   CBC with Differential/Platelet   COMPLETE METABOLIC PANEL WITH GFR   Lipid panel   Hemoglobin A1c    Notify office for further evaluation and treatment, questions or concerns if any reported s/s fail to improve.   The patient was advised to call back or seek an in-person evaluation if any symptoms worsen or if the condition fails to improve as anticipated.   Further disposition pending results of labs. Discussed med's effects and SE's.    I discussed the assessment and treatment plan with the patient. The patient was provided an opportunity to ask questions and all were answered. The patient agreed with the plan and demonstrated an understanding of the instructions.  Discussed med's effects and SE's. Screening labs and tests as requested with regular follow-up as recommended.  I provided 25 minutes of face-to-face time during this encounter including counseling, chart review, and critical decision making was preformed.  Today's Plan of Care is based on a patient-centered health care approach known as shared decision making - the decisions, tests and treatments allow for patient preferences and values to be balanced with clinical evidence.     Future Appointments  Date Time Provider Department Center  07/28/2023  9:00 AM Dawn Pope, Dawn Patten, NP [facility] None    ------------------------------------------------------------------------------------------------------------------   HPI BP (!) 158/94   Pulse 73   Temp 97.8 F (36.6 C)   Ht 5\' 4"  (1.626 m)   Wt 165 lb 3.2 oz (74.9 kg)   SpO2 98%   BMI 28.36 kg/m   51 y.o.female presents for three month follow up.  She has a hx of HTN.  Has been taking Ziac intermittently.  BP elevated in clinic today.  She is asymptomatic.  States that she was taking Bisoprolol 5 mg (Zebeta) daily but after trying to donate blood 12/14/2022 her BP was too elevated and she RTC for medication management  when hydrochlorothiazide  was added. She has not been checking BP. She denies N/V, paralysis, difficulty speaking, trouble walking, confusion, vision changes, CP, heart palpitations, SOB, diaphoresis.  BP Readings from Last 3 Encounters:  01/25/23 (!) 158/94  12/15/22 (!) 202/130  07/27/22 138/72   She has a known uterine fibroid, underwent uterine artery embolization in 2015, sx improved since then, continues to follow with GYN - Dawn Pope. Last Pap 2 years ago.    She has menstrual cycles, states they can occasionally be heavy but are not as bad as they used to be, has been on iron supplement in the last year, taking every other day only, reports gave blood last week.  Still continuing to have regular cycles.  Denies any s/s of menopause.   She has fallen into the pre-diabetic range.  Admits to eating more sweets and not controlling dietary intake.  She does not exercise. Denies polyuria and polydipsia. Lab Results  Component Value Date   HGBA1C 5.8 (H) 07/27/2022     Past Medical History:  Diagnosis Date   Anemia    COVID-19 12/05/2019   Fibroids    Hypertension    Hypokalemia    Hypomagnesemia    Pulmonary hypertension ([facility]CC) Via Echo 2010   EF 65% RVSP 45 (Dr. Jens Pope)   Vitamin D deficiency      No Known Allergies  Current Outpatient Medications on File Prior to Visit  Medication Sig   bisoprolol-hydrochlorothiazide (ZIAC) 5-6.25 MG tablet Start by taking 1 tab daily for BP goal <130/80.   Cholecalciferol (VITAMIN D-3) 5000 UNITS TABS Take 2 tablets by mouth every evening. Take 10,000 IU daily   Cyanocobalamin (B-12 PO) Take by mouth daily.   Ferrous Sulfate (IRON PO) Take by mouth daily.   OVER THE COUNTER MEDICATION Takes Potassium, Magnesium, Calcium once daily   bisoprolol (ZEBETA) 5 MG tablet Take 1 tablet (5 mg total) by mouth daily. (Patient not taking: Reported on 01/25/2023)   No current facility-administered medications on file prior to visit.    ROS: all negative except what  is noted in the HPI.   Physical Exam:  BP (!) 158/94   Pulse 73   Temp 97.8 F (36.6 C)   Ht 5\' 4"  (1.626 m)   Wt 165 lb 3.2 oz (74.9 kg)   SpO2 98%   BMI 28.36 kg/m   General Appearance: NAD.  Awake, conversant and cooperative. Eyes: PERRLA, EOMs intact.  Sclera white.  Conjunctiva without erythema. Sinuses: No frontal/maxillary tenderness.  No nasal discharge. Nares patent.  ENT/Mouth: Ext aud canals clear.  Bilateral TMs w/DOL and without erythema or bulging. Hearing intact.  Posterior pharynx without swelling or exudate.  Tonsils without swelling or erythema.  Neck: Supple.  No masses, nodules or thyromegaly. Respiratory: Effort is regular with non-labored breathing. Breath sounds are equal bilaterally without rales, rhonchi, wheezing or stridor.  Cardio: RRR with no MRGs. Brisk peripheral pulses without edema.  Abdomen: Active BS in all four quadrants.  Soft and non-tender without guarding, rebound tenderness, hernias or masses. Lymphatics: Non tender without lymphadenopathy.  Musculoskeletal: Full ROM, 5/5 strength, normal ambulation.  No clubbing or cyanosis. Skin: Appropriate color for ethnicity. Warm without rashes, lesions, ecchymosis, ulcers.  Neuro: CN II-XII grossly normal. Normal muscle tone without cerebellar symptoms and intact sensation.   Psych: AO X 3,  appropriate mood and affect, insight and judgment.     [name], NP 9:56 AM [facility]

## 2023-01-25 NOTE — Patient Instructions (Signed)
Blood Pressure Record Sheet To take your blood pressure, you will need a blood pressure machine. You may be prescribed one, or you can buy a blood pressure machine (blood pressure monitor) at your clinic, drug store, or online. When choosing one, look for these features: An automatic monitor that has an arm cuff. A cuff that wraps snugly, but not too tightly, around your upper arm. You should be able to fit only one finger between your arm and the cuff. A device that stores blood pressure reading results. Do not choose a monitor that measures your blood pressure from your wrist or finger. Follow your health care provider's instructions for how to take your blood pressure. To use this form: Get one reading in the morning (a.m.) before you take any medicines. Get one reading in the evening (p.m.) before supper. Take at least two readings with each blood pressure check. This makes sure the results are correct. Wait 1-2 minutes between measurements. Write down the results in the spaces on this form. Repeat this once a week, or as told by your health care provider. Make a follow-up appointment with your health care provider to discuss the results. Blood pressure log Date: _______________________ a.m. _____________________(1st reading) _____________________(2nd reading) p.m. _____________________(1st reading) _____________________(2nd reading) Date: _______________________ a.m. _____________________(1st reading) _____________________(2nd reading) p.m. _____________________(1st reading) _____________________(2nd reading) Date: _______________________ a.m. _____________________(1st reading) _____________________(2nd reading) p.m. _____________________(1st reading) _____________________(2nd reading) Date: _______________________ a.m. _____________________(1st reading) _____________________(2nd reading) p.m. _____________________(1st reading) _____________________(2nd reading) Date:  _______________________ a.m. _____________________(1st reading) _____________________(2nd reading) p.m. _____________________(1st reading) _____________________(2nd reading) This information is not intended to replace advice given to you by your health care provider. Make sure you discuss any questions you have with your health care provider. Document Revised: 01/30/2021 Document Reviewed: 01/30/2021 Elsevier Patient Education  2024 [employer].

## 2023-01-26 LAB — CBC WITH DIFFERENTIAL/PLATELET
Absolute Monocytes: 435 {cells}/uL (ref 200–950)
Basophils Absolute: 21 {cells}/uL (ref 0–200)
Basophils Relative: 0.5 %
Eosinophils Absolute: 90 {cells}/uL (ref 15–500)
Eosinophils Relative: 2.2 %
HCT: 37.3 % (ref 35.0–45.0)
Hemoglobin: 12.4 g/dL (ref 11.7–15.5)
Lymphs Abs: 1115 {cells}/uL (ref 850–3900)
MCH: 30.3 pg (ref 27.0–33.0)
MCHC: 33.2 g/dL (ref 32.0–36.0)
MCV: 91.2 fL (ref 80.0–100.0)
MPV: 11.5 fL (ref 7.5–12.5)
Monocytes Relative: 10.6 %
Neutro Abs: 2440 cells/uL (ref 1500–7800)
Neutrophils Relative %: 59.5 %
Platelets: 233 10*3/uL (ref 140–400)
RBC: 4.09 10*6/uL (ref 3.80–5.10)
RDW: 12.1 % (ref 11.0–15.0)
Total Lymphocyte: 27.2 %
WBC: 4.1 10*3/uL (ref 3.8–10.8)

## 2023-01-26 LAB — LIPID PANEL
Cholesterol: 182 mg/dL (ref <200)
HDL: 54 mg/dL (ref >=50)
LDL Cholesterol (Calc): 113 mg/dL — ABNORMAL HIGH
Non-HDL Cholesterol (Calc): 128 mg/dL (ref <130)
Total CHOL/HDL Ratio: 3.4 (calc) (ref <5.0)
Triglycerides: 67 mg/dL (ref <150)

## 2023-01-26 LAB — HEMOGLOBIN A1C
Hgb A1c MFr Bld: 5.9 %{Hb} — ABNORMAL HIGH (ref <5.7)
Mean Plasma Glucose: 123 mg/dL
eAG (mmol/L): 6.8 mmol/L

## 2023-01-26 LAB — COMPLETE METABOLIC PANEL WITH GFR
AG Ratio: 1.7 (calc) (ref 1.0–2.5)
ALT: 17 U/L (ref 6–29)
AST: 19 U/L (ref 10–35)
Albumin: 4.3 g/dL (ref 3.6–5.1)
Alkaline phosphatase (APISO): 79 U/L (ref 37–153)
BUN: 14 mg/dL (ref 7–25)
CO2: 28 mmol/L (ref 20–32)
Calcium: 9.5 mg/dL (ref 8.6–10.4)
Chloride: 104 mmol/L (ref 98–110)
Creat: 0.67 mg/dL (ref 0.50–1.03)
Globulin: 2.5 g/dL (calc) (ref 1.9–3.7)
Glucose, Bld: 100 mg/dL — ABNORMAL HIGH (ref 65–99)
Potassium: 3.9 mmol/L (ref 3.5–5.3)
Sodium: 139 mmol/L (ref 135–146)
Total Bilirubin: 0.5 mg/dL (ref 0.2–1.2)
Total Protein: 6.8 g/dL (ref 6.1–8.1)
eGFR: 106 mL/min/{1.73_m2} (ref >=60)

## 2023-05-03 ENCOUNTER — Ambulatory Visit: Payer: BC Managed Care – PPO | Admitting: Nurse Practitioner

## 2023-05-10 ENCOUNTER — Encounter: Payer: Self-pay | Admitting: Nurse Practitioner

## 2023-05-10 ENCOUNTER — Ambulatory Visit (INDEPENDENT_AMBULATORY_CARE_PROVIDER_SITE_OTHER): Payer: BC Managed Care – PPO | Admitting: Nurse Practitioner

## 2023-05-10 VITALS — BP 160/94 | HR 80 | Temp 98.7°F | Ht 64.0 in | Wt 166.4 lb

## 2023-05-10 DIAGNOSIS — D259 Leiomyoma of uterus, unspecified: Secondary | ICD-10-CM

## 2023-05-10 DIAGNOSIS — E663 Overweight: Secondary | ICD-10-CM | POA: Diagnosis not present

## 2023-05-10 DIAGNOSIS — E559 Vitamin D deficiency, unspecified: Secondary | ICD-10-CM

## 2023-05-10 DIAGNOSIS — E782 Mixed hyperlipidemia: Secondary | ICD-10-CM

## 2023-05-10 DIAGNOSIS — I10 Essential (primary) hypertension: Secondary | ICD-10-CM

## 2023-05-10 DIAGNOSIS — I272 Pulmonary hypertension, unspecified: Secondary | ICD-10-CM | POA: Diagnosis not present

## 2023-05-10 DIAGNOSIS — R7303 Prediabetes: Secondary | ICD-10-CM

## 2023-05-10 DIAGNOSIS — Z79899 Other long term (current) drug therapy: Secondary | ICD-10-CM

## 2023-05-10 DIAGNOSIS — D509 Iron deficiency anemia, unspecified: Secondary | ICD-10-CM

## 2023-05-10 NOTE — Progress Notes (Signed)
Assessment and Plan:  Dawn Pope was seen today for an episodic visit.  Diagnoses and all order for this visit:  Essential hypertension/Pulmonary HTN Continue Bisoprolol hydrochlorothiazide Daily Monitor BP for 1 week and if >130/80 discussed adding Valsartan Discussed DASH (Dietary Approaches to Stop Hypertension) DASH diet is lower in sodium than a typical American diet. Cut back on foods that are high in saturated fat, cholesterol, and trans fats. Eat more whole-grain foods, fish, poultry, and nuts Remain active and exercise as tolerated daily.  Report to ER for any increase in stroke like symptoms, including HA, N/V, paralysis, difficulty speaking, trouble walking, confusion, vision changes, CP, heart palpitations, SOB, diaphoresis.  Hyperlipidemia, unspecified hyperlipidemia type Discussed lifestyle modifications. Recommended diet heavy in fruits and veggies, omega 3's. Decrease consumption of animal meats, cheeses, and dairy products. Remain active and exercise as tolerated. Continue to monitor.  Overweight (BMI 25.0-29.9) Discussed appropriate BMI Diet modification. Physical activity. Encouraged/praised to build confidence.  Medication management All medications discussed and reviewed in full. All questions and concerns regarding medications addressed.    Uterine leiomyoma, unspecified location Following with GYN - plans to schedule Due for PAP    Vitamin D deficiency Continue supplement Monitor levels   Iron deficiency anemia, unspecified iron deficiency anemia type Continue Ferrous Sulfate Monitor CBC/Anemia panel  Prediabetes Education: Reviewed 'ABCs' of diabetes management  Discussed goals to be met and/or maintained include A1C (<7) Blood pressure (<130/80) Cholesterol (LDL <70) Continue Eye Exam yearly  Continue Dental Exam Q6 mo Discussed dietary recommendations Discussed Physical Activity recommendations Check A1C  Orders Placed This  Encounter  Procedures   CBC with Differential/Platelet   COMPLETE METABOLIC PANEL WITH GFR   Lipid panel   Hemoglobin A1c    Notify office for further evaluation and treatment, questions or concerns if any reported s/s fail to improve.   The patient was advised to call back or seek an in-person evaluation if any symptoms worsen or if the condition fails to improve as anticipated.   Further disposition pending results of labs. Discussed med's effects and SE's.    I discussed the assessment and treatment plan with the patient. The patient was provided an opportunity to ask questions and all were answered. The patient agreed with the plan and demonstrated an understanding of the instructions.  Discussed med's effects and SE's. Screening labs and tests as requested with regular follow-up as recommended.  I provided 30 minutes of face-to-face time during this encounter including counseling, chart review, and critical decision making was preformed.  Today's Plan of Care is based on a patient-centered health care approach known as shared decision making - the decisions, tests and treatments allow for patient preferences and values to be balanced with clinical evidence.     Future Appointments  Date Time Provider Department Center  05/10/2023  3:45 PM [name], NP [facility] None  07/28/2023  9:00 AM Patrici Minnis, Archie Patten, NP [facility] None    ------------------------------------------------------------------------------------------------------------------   HPI Ht 5\' 4"  (1.626 m)   Wt 166 lb 6.4 oz (75.5 kg)   BMI 28.56 kg/m   52 y.o.female presents for three month follow up.  She has a hx of HTN.  Has been taking Ziac intermittently.  BP elevated in clinic today.  She is asymptomatic.  States that she was taking Bisoprolol 5 mg (Zebeta) daily but after trying to donate blood 12/14/2022 her BP was too elevated and she RTC for medication management when hydrochlorothiazide was added.  She has not been checking  BP. She denies N/V, paralysis, difficulty speaking, trouble walking, confusion, vision changes, CP, heart palpitations, SOB, diaphoresis.  BP Readings from Last 3 Encounters:  01/25/23 (!) 158/94  12/15/22 (!) 202/130  07/27/22 138/72   She has a known uterine fibroid, underwent uterine artery embolization in 2015, sx improved since then, continues to follow with GYN - [name]. Last Pap 2 years ago.    She has menstrual cycles, states they can occasionally be heavy but are not as bad as they used to be, has been on iron supplement in the last year, taking every other day only, reports gave blood last week.  Still continuing to have regular cycles.  Denies any s/s of menopause.   She has fallen into the pre-diabetic range.  Admits to eating more sweets and not controlling dietary intake.  She does not exercise. Denies polyuria and polydipsia. Lab Results  Component Value Date   HGBA1C 5.9 (H) 01/25/2023     Past Medical History:  Diagnosis Date   Anemia    COVID-19 12/05/2019   Fibroids    Hypertension    Hypokalemia    Hypomagnesemia    Pulmonary hypertension (HCC) Via Echo 2010   EF 65% RVSP 45 (Dr. Jens Som)   Vitamin D deficiency      No Known Allergies  Current Outpatient Medications on File Prior to Visit  Medication Sig   bisoprolol (ZEBETA) 5 MG tablet Take 1 tablet (5 mg total) by mouth daily. (Patient not taking: Reported on 01/25/2023)   bisoprolol-hydrochlorothiazide (ZIAC) 5-6.25 MG tablet Start by taking 1 tab daily for BP goal <130/80.   Cholecalciferol (VITAMIN D-3) 5000 UNITS TABS Take 2 tablets by mouth every evening. Take 10,000 IU daily   Cyanocobalamin (B-12 PO) Take by mouth daily.   Ferrous Sulfate (IRON PO) Take by mouth daily.   OVER THE COUNTER MEDICATION Takes Potassium, Magnesium, Calcium once daily   valsartan (DIOVAN) 40 MG tablet Take 1 tablet (40 mg total) by mouth daily.   No current facility-administered  medications on file prior to visit.    ROS: all negative except what is noted in the HPI.   Physical Exam:  Ht 5\' 4"  (1.626 m)   Wt 166 lb 6.4 oz (75.5 kg)   BMI 28.56 kg/m   General Appearance: NAD.  Awake, conversant and cooperative. Eyes: PERRLA, EOMs intact.  Sclera white.  Conjunctiva without erythema. Sinuses: No frontal/maxillary tenderness.  No nasal discharge. Nares patent.  ENT/Mouth: Ext aud canals clear.  Bilateral TMs w/DOL and without erythema or bulging. Hearing intact.  Posterior pharynx without swelling or exudate.  Tonsils without swelling or erythema.  Neck: Supple.  No masses, nodules or thyromegaly. Respiratory: Effort is regular with non-labored breathing. Breath sounds are equal bilaterally without rales, rhonchi, wheezing or stridor.  Cardio: RRR with no MRGs. Brisk peripheral pulses without edema.  Abdomen: Active BS in all four quadrants.  Soft and non-tender without guarding, rebound tenderness, hernias or masses. Lymphatics: Non tender without lymphadenopathy.  Musculoskeletal: Full ROM, 5/5 strength, normal ambulation.  No clubbing or cyanosis. Skin: Appropriate color for ethnicity. Warm without rashes, lesions, ecchymosis, ulcers.  Neuro: CN II-XII grossly normal. Normal muscle tone without cerebellar symptoms and intact sensation.   Psych: AO X 3,  appropriate mood and affect, insight and judgment.     [name], NP 3:06 PM [facility]

## 2023-05-11 ENCOUNTER — Other Ambulatory Visit: Payer: Self-pay | Admitting: Nurse Practitioner

## 2023-05-11 LAB — COMPLETE METABOLIC PANEL WITH GFR
AG Ratio: 1.5 (calc) (ref 1.0–2.5)
ALT: 23 U/L (ref 6–29)
AST: 23 U/L (ref 10–35)
Albumin: 4.8 g/dL (ref 3.6–5.1)
Alkaline phosphatase (APISO): 93 U/L (ref 37–153)
BUN: 15 mg/dL (ref 7–25)
CO2: 29 mmol/L (ref 20–32)
Calcium: 10.1 mg/dL (ref 8.6–10.4)
Chloride: 102 mmol/L (ref 98–110)
Creat: 0.71 mg/dL (ref 0.50–1.03)
Globulin: 3.3 g/dL (ref 1.9–3.7)
Glucose, Bld: 89 mg/dL (ref 65–99)
Potassium: 4 mmol/L (ref 3.5–5.3)
Sodium: 141 mmol/L (ref 135–146)
Total Bilirubin: 0.5 mg/dL (ref 0.2–1.2)
Total Protein: 8.1 g/dL (ref 6.1–8.1)
eGFR: 102 mL/min/{1.73_m2} (ref >=60)

## 2023-05-11 LAB — CBC WITH DIFFERENTIAL/PLATELET
Absolute Lymphocytes: 1550 {cells}/uL (ref 850–3900)
Absolute Monocytes: 535 {cells}/uL (ref 200–950)
Basophils Absolute: 30 {cells}/uL (ref 0–200)
Basophils Relative: 0.6 %
Eosinophils Absolute: 80 {cells}/uL (ref 15–500)
Eosinophils Relative: 1.6 %
HCT: 41.8 % (ref 35.0–45.0)
Hemoglobin: 14.1 g/dL (ref 11.7–15.5)
MCH: 30.1 pg (ref 27.0–33.0)
MCHC: 33.7 g/dL (ref 32.0–36.0)
MCV: 89.3 fL (ref 80.0–100.0)
MPV: 11 fL (ref 7.5–12.5)
Monocytes Relative: 10.7 %
Neutro Abs: 2805 {cells}/uL (ref 1500–7800)
Neutrophils Relative %: 56.1 %
Platelets: 289 10*3/uL (ref 140–400)
RBC: 4.68 10*6/uL (ref 3.80–5.10)
RDW: 12.1 % (ref 11.0–15.0)
Total Lymphocyte: 31 %
WBC: 5 10*3/uL (ref 3.8–10.8)

## 2023-05-11 LAB — HEMOGLOBIN A1C
Hgb A1c MFr Bld: 5.8 %{Hb} — ABNORMAL HIGH (ref <5.7)
Mean Plasma Glucose: 120 mg/dL
eAG (mmol/L): 6.6 mmol/L

## 2023-05-11 LAB — LIPID PANEL
Cholesterol: 221 mg/dL — ABNORMAL HIGH (ref <200)
HDL: 47 mg/dL — ABNORMAL LOW (ref >=50)
LDL Cholesterol (Calc): 154 mg/dL — ABNORMAL HIGH
Non-HDL Cholesterol (Calc): 174 mg/dL — ABNORMAL HIGH (ref <130)
Total CHOL/HDL Ratio: 4.7 (calc) (ref <5.0)
Triglycerides: 90 mg/dL (ref <150)

## 2023-05-11 MED ORDER — ATORVASTATIN CALCIUM 10 MG PO TABS: 10.0000 mg | ORAL_TABLET | Freq: Every day | ORAL | 11 refills | Status: DC

## 2023-05-18 ENCOUNTER — Encounter: Payer: Self-pay | Admitting: Nurse Practitioner

## 2023-05-30 ENCOUNTER — Other Ambulatory Visit: Payer: Self-pay | Admitting: Nurse Practitioner

## 2023-05-30 DIAGNOSIS — I10 Essential (primary) hypertension: Secondary | ICD-10-CM

## 2023-05-30 DIAGNOSIS — Z79899 Other long term (current) drug therapy: Secondary | ICD-10-CM

## 2023-05-30 DIAGNOSIS — I272 Pulmonary hypertension, unspecified: Secondary | ICD-10-CM

## 2023-07-14 ENCOUNTER — Encounter: Payer: BC Managed Care – PPO | Admitting: Nurse Practitioner

## 2023-07-28 ENCOUNTER — Encounter: Payer: BC Managed Care – PPO | Admitting: Nurse Practitioner

## 2023-08-09 ENCOUNTER — Encounter: Payer: BC Managed Care – PPO | Admitting: Nurse Practitioner

## 2023-12-12 NOTE — Progress Notes (Signed)
 Subjective Patient ID: Dawn Pope is a 53 y.o. female.  Chief Complaint  Patient presents with  . Medication Refill    Out of BP medication, took last one today    The following information was reviewed by members of the visit team:  Tobacco  Allergies  Meds  Problems      53 yo female with HTN needs refill of HTN meds and is establishing with new PCP this next week. She notes she is active at work. She has only been taking one HTN med and unsure of why diovan  was stopped and thinks it was an error from her old PCP.   Medication Refill Pertinent negatives include no chest pain, fatigue, fever, headaches or weakness.  Medications Ordered Prior to Encounter[1] Allergies[2]   Review of Systems  Constitutional:  Negative for fatigue and fever.  Respiratory:  Negative for chest tightness and shortness of breath.   Cardiovascular:  Negative for chest pain and leg swelling.  Genitourinary:  Negative for difficulty urinating.  Neurological:  Negative for dizziness, weakness and headaches.  Psychiatric/Behavioral:  Negative for confusion.    BP 159/71   Pulse 83   Temp 97.7 F (36.5 C) (Temporal)   Ht 1.6 m (5' 3)   Wt 74.8 kg (165 lb)   SpO2 98%   BMI 29.23 kg/m   Objective Physical Exam Vitals and nursing note reviewed.  Constitutional:      General: She is not in acute distress.    Appearance: Normal appearance. She is not ill-appearing.  HENT:     Head: Normocephalic and atraumatic.     Right Ear: External ear normal.     Left Ear: External ear normal.     Nose: Nose normal.     Mouth/Throat:     Mouth: Mucous membranes are moist.   Eyes:     General: No scleral icterus.       Right eye: No discharge.        Left eye: No discharge.     Conjunctiva/sclera: Conjunctivae normal.    Cardiovascular:     Rate and Rhythm: Normal rate and regular rhythm.     Pulses: Normal pulses.     Heart sounds: Normal heart sounds.  Pulmonary:     Effort: Pulmonary  effort is normal. No respiratory distress.     Breath sounds: Normal breath sounds. No wheezing, rhonchi or rales.  Abdominal:     General: There is no distension.     Palpations: Abdomen is soft.     Tenderness: There is no abdominal tenderness.   Musculoskeletal:        General: Normal range of motion.     Cervical back: Normal range of motion and neck supple. No rigidity or tenderness.  Lymphadenopathy:     Cervical: No cervical adenopathy.   Skin:    General: Skin is warm.     Capillary Refill: Capillary refill takes less than 2 seconds.     Findings: No rash.   Neurological:     General: No focal deficit present.     Mental Status: She is alert and oriented to person, place, and time.     Motor: No weakness.     Gait: Gait normal.   Psychiatric:        Thought Content: Thought content normal.        Judgment: Judgment normal.     Assessment/Plan 1. Elevated blood pressure reading in office with diagnosis of hypertension  bisoprolol  (ZEBETA ) 5  mg tablet   valsartan  (DIOVAN ) 40 mg tablet     -recheck BP if stays above 130/90 follow up with Primary care.  -advised since new PCP appointment in next 2 weeks agree to wait for labs with new PCP -restart Diovan  with BP over 140/90. -Meds as directed  -ER if symptoms increase, Primary care if symptoms do not improve    Electronically signed: Eleanor Clemencia Sharps, PA-C 12/12/2023  9:50 AM        [1] Current Outpatient Medications on File Prior to Visit  Medication Sig Dispense Refill  . bisoprolol -hydroCHLOROthiazide  (ZIAC ) 5-6.25 mg per tablet TAKE ONE TABLET BY MOUTH ONE TIME DAILY FOR BLOOD PRESSURE; GOAL LESS THAN 130/80     No current facility-administered medications on file prior to visit.  [2] No Known Allergies

## 2023-12-16 ENCOUNTER — Ambulatory Visit

## 2023-12-28 ENCOUNTER — Encounter: Payer: Self-pay | Admitting: Internal Medicine

## 2024-01-03 ENCOUNTER — Telehealth: Payer: Self-pay

## 2024-01-03 ENCOUNTER — Ambulatory Visit

## 2024-01-03 VITALS — BP 192/125 | HR 73 | Temp 97.7°F | Resp 16 | Ht 63.0 in | Wt 166.4 lb

## 2024-01-03 DIAGNOSIS — Z0000 Encounter for general adult medical examination without abnormal findings: Secondary | ICD-10-CM

## 2024-01-03 DIAGNOSIS — Z124 Encounter for screening for malignant neoplasm of cervix: Secondary | ICD-10-CM

## 2024-01-03 DIAGNOSIS — Z7689 Persons encountering health services in other specified circumstances: Secondary | ICD-10-CM

## 2024-01-03 DIAGNOSIS — Z1231 Encounter for screening mammogram for malignant neoplasm of breast: Secondary | ICD-10-CM

## 2024-01-03 DIAGNOSIS — I10 Essential (primary) hypertension: Secondary | ICD-10-CM

## 2024-01-03 LAB — POCT GLYCOSYLATED HEMOGLOBIN (HGB A1C): Hemoglobin A1C: 5.7 % — AB (ref 4.0–5.6)

## 2024-01-03 MED ORDER — AMLODIPINE BESYLATE 5 MG PO TABS: 5.0000 mg | ORAL_TABLET | Freq: Every day | ORAL | 1 refills | Status: DC

## 2024-01-03 MED ORDER — VALSARTAN 40 MG PO TABS: 40.0000 mg | ORAL_TABLET | Freq: Every day | ORAL | 1 refills | Status: DC

## 2024-01-03 NOTE — Telephone Encounter (Signed)
 Called pt and left vm to call office back to schedule a 2 wk fu from today's visit per pt pcp.   Message from PCP Can we call this patient to schedule a 2 week follow up for BP check and medication adherence check please, since her BP was high today? We can keep the 1 month for physical as well.

## 2024-01-03 NOTE — Progress Notes (Signed)
 Patient ID: JYLA HOPF, female    DOB: 1970/08/07  MRN: [id number]  CC: Establish Care (Patient is here to established care with provider./~health hx address/~care gaps address/ )   Subjective: Dawn Pope is a 53 y.o. female with past medical history of hypertension who presents to clinic to Establish  care. No acute concerns at this time. Works for [employer] center. Reports she has a blood pressure machine at home but does not keep a log. In regards to high blood pressure in office today, pt denies headache, vision changes, chest pain, or shortness of breath. Reports currently only taking Bisoprolol  for blood pressure.    Last CPE: 05/10/2023 Last eye exam: N/A Last dental exam: N/A Exercise: Walks 2 miles a day for work related tasks Diet: Tries to eat healthy but wants to do better   Last pap: ~ >3 years ago LMP: >13months Last Mammogram: ~ >3 years ago    Patient Active Problem List   Diagnosis Date Noted   Hyperlipidemia 11/18/2020   Anemia 11/18/2020   Multinodular thyroid  05/21/2020   Overweight (BMI 25.0-29.9) 05/20/2020   Fibroid, uterine    Hypertension    Vitamin D  deficiency       No Known Allergies  Social History   Socioeconomic History   Marital status: Single    Spouse name: Not on file   Number of children: 2   Years of education: Not on file   Highest education level: Not on file  Occupational History   Occupation: Chief of Staff: [employer]  Tobacco Use   Smoking status: Never   Smokeless tobacco: Never  Vaping Use   Vaping status: Never Used  Substance and Sexual Activity   Alcohol use: No   Drug use: No   Sexual activity: Not Currently  Other Topics Concern   Not on file  Social History Narrative   Not on file   Social Drivers of Health   Financial Resource Strain: Low Risk  (01/03/2024)   Overall Financial Resource Strain (CARDIA)    Difficulty of Paying Living Expenses: Not very hard  Food Insecurity: Low  Risk  (12/12/2023)   Received from [facility]   Hunger Vital Sign    Within the past 12 months, you worried that your food would run out before you got money to buy more: Never true    Within the past 12 months, the food you bought just didn't last and you didn't have money to get more. : Never true  Transportation Needs: No Transportation Needs (12/12/2023)   Received from [facility]    In the past 12 months, has lack of reliable transportation kept you from medical appointments, meetings, work or from getting things needed for daily living? : No  Physical Activity: Insufficiently Active (01/03/2024)   Exercise Vital Sign    Days of Exercise per Week: 7 days    Minutes of Exercise per Session: 20 min  Stress: No Stress Concern Present (05/19/2018)   [employer] of Occupational Health - Occupational Stress Questionnaire    Feeling of Stress : Only a little  Social Connections: Moderately Isolated (01/03/2024)   Social Connection and Isolation Panel    Frequency of Communication with Friends and Family: Once a week    Frequency of Social Gatherings with Friends and Family: Once a week    Attends Religious Services: 1 to 4 times per year    Active Member of Clubs or  Organizations: Yes    Attends Banker Meetings: 1 to 4 times per year    Marital Status: Never married  Intimate Partner Violence: Not At Risk (01/03/2024)   Humiliation, Afraid, Rape, and Kick questionnaire    Fear of Current or Ex-Partner: No    Emotionally Abused: No    Physically Abused: No    Sexually Abused: No    Family History  Problem Relation Age of Onset   Hypertension Mother    Hyperlipidemia Mother    Hyperlipidemia Father    Hypertension Father    Stroke Father 43   Heart disease Paternal Grandmother     Past Surgical History:  Procedure Laterality Date   IR GENERIC HISTORICAL  07/04/2014   IR RADIOLOGIST EVAL & MGMT 07/04/2014 Norleen Roulette, MD [facility] INTERV RAD    UTERINE ARTERY EMBOLIZATION  04/27/2014    ROS: Review of Systems Negative except as stated above  PHYSICAL EXAM: BP (!) 192/125   Pulse 73   Temp 97.7 F (36.5 C) (Oral)   Resp 16   Ht 5' 3 (1.6 m)   Wt 166 lb 6.4 oz (75.5 kg)   SpO2 97%   BMI 29.48 kg/m   Physical Exam  General: well-appearing, no acute distress Eyes: Pupils reactive to light, extraocular movements intact Skin: no jaundice, rashes, or lesions Cardiovascular: regular heart rate and rhythm, normal S1/S2, no murmurs, gallops, or rubs, peripheral pulses 2+ bilaterally Chest: no skeletal deformity, lungs clear to auscultation bilaterally, equal breath sounds bilaterally Abdomen: soft, non-distended,  Extremities: no peripheral edema   ASSESSMENT AND PLAN:  1. Primary hypertension (Primary)  - Chronic condition: Asymptomatic hypertension  - BP: 192/125 - Goal BP <140/90 - Recommend adhering to a low sodium diet such as the DASH diet. Educational handout provided - Adjustments made to BP medication regimen this visit - Pt advised on potential risks of long term elevated blood pressure which include stroke, kidney damage, and other organ damage.     - Stop Bisoprolol  5mg   - Start amLODipine  (NORVASC ) 5 MG tablet; Take 1 tablet (5 mg total) by mouth daily.  Dispense: 90 tablet; Refill: 1 - Discussed potential side effects such as peripheral edema, flushing, dizziness. Patient verbalized understanding. - Start valsartan  (DIOVAN ) 40 MG tablet; Take 1 tablet (40 mg total) by mouth daily.  Dispense: 90 tablet; Refill: 1  2. Encounter to establish care with new provider - Plan to follow up in 1 month for yearly physical exam - POCT glycosylated hemoglobin (Hb A1C), screening for diabetes given previous pre-diabetic range.   3. Screening mammogram for breast cancer - MM DIGITAL SCREENING BILATERAL; Future  4. Cervical cancer screening - Per pt preference, referral placed for Pap smear - Ambulatory referral  to Gynecology   Patient was given the opportunity to ask questions.  Patient verbalized understanding of the plan and was able to repeat key elements of the plan. Patient was given clear instructions to go to Emergency Department or return to medical center if symptoms don't improve, worsen, or new problems develop.The patient verbalized understanding.   Orders Placed This Encounter  Procedures   MM DIGITAL SCREENING BILATERAL   Ambulatory referral to Gynecology   POCT glycosylated hemoglobin (Hb A1C)     Requested Prescriptions   Signed Prescriptions Disp Refills   amLODipine  (NORVASC ) 5 MG tablet 90 tablet 1    Sig: Take 1 tablet (5 mg total) by mouth daily.   valsartan  (DIOVAN ) 40 MG tablet 90 tablet 1  Sig: Take 1 tablet (40 mg total) by mouth daily.    Return in about 1 month (around 02/03/2024) for physical.  Sula Leavy Rode, PA-C

## 2024-03-14 ENCOUNTER — Ambulatory Visit: Admission: RE | Admit: 2024-03-14 | Discharge: 2024-03-14 | Disposition: A | Source: Ambulatory Visit

## 2024-03-14 DIAGNOSIS — Z1231 Encounter for screening mammogram for malignant neoplasm of breast: Secondary | ICD-10-CM

## 2024-03-17 ENCOUNTER — Ambulatory Visit

## 2024-03-17 ENCOUNTER — Other Ambulatory Visit: Payer: Self-pay

## 2024-03-17 VITALS — BP 178/111 | HR 92 | Temp 97.6°F | Resp 16 | Wt 166.0 lb

## 2024-03-17 DIAGNOSIS — Z13220 Encounter for screening for lipoid disorders: Secondary | ICD-10-CM

## 2024-03-17 DIAGNOSIS — I10 Essential (primary) hypertension: Secondary | ICD-10-CM | POA: Diagnosis not present

## 2024-03-17 DIAGNOSIS — Z13228 Encounter for screening for other metabolic disorders: Secondary | ICD-10-CM | POA: Diagnosis not present

## 2024-03-17 DIAGNOSIS — Z131 Encounter for screening for diabetes mellitus: Secondary | ICD-10-CM | POA: Diagnosis not present

## 2024-03-17 DIAGNOSIS — Z136 Encounter for screening for cardiovascular disorders: Secondary | ICD-10-CM

## 2024-03-17 MED ORDER — AMLODIPINE BESYLATE 10 MG PO TABS
10.0000 mg | ORAL_TABLET | Freq: Every day | ORAL | 2 refills | Status: AC
  Filled 2024-03-17: qty 30, 30d supply, fill #0
  Filled 2024-05-13 – 2024-05-14 (×2): qty 30, 30d supply, fill #1
  Filled 2024-06-12 – 2024-06-13 (×2): qty 30, 30d supply, fill #2

## 2024-03-17 MED ORDER — VALSARTAN 40 MG PO TABS
40.0000 mg | ORAL_TABLET | Freq: Every day | ORAL | 2 refills | Status: DC
  Filled 2024-03-17: qty 30, 30d supply, fill #0
  Filled 2024-04-17: qty 90, 90d supply, fill #0

## 2024-03-17 NOTE — Progress Notes (Signed)
     Patient ID: Dawn Pope, female    DOB: 15-Feb-1971  MRN: [id number]  CC: Medical Management of Chronic Issues   Subjective: Dawn Pope is a 53 y.o. female with past medical history of hypertension who presents to clinic for follow up on blood pressure. Pt reports she only picked up one of the blood pressure medications previously prescribed. Denies headache, increased urinary frequency or lower extremity swelling.   No Known Allergies  ROS: Review of Systems Negative except as stated above  PHYSICAL EXAM: BP (!) 178/111   Pulse 92   Temp 97.6 F (36.4 C) (Oral)   Resp 16   Wt 166 lb (75.3 kg)   SpO2 98%   BMI 29.41 kg/m   Physical Exam  General: well-appearing, no acute distress Skin: no jaundice, rashes, or lesions Cardiovascular: regular heart rate and rhythm, normal S1/S2, no murmurs, gallops, or rubs, peripheral pulses 2+ bilaterally Chest: no skeletal deformity, lungs clear to auscultation bilaterally, equal breath sounds bilaterally Musculoskeletal: normal gait Extremities: no peripheral edema  ASSESSMENT AND PLAN:  1. Primary hypertension (Primary) BP: 178/111, not at goal. Pt did not initiate valsartan .  Increasing amlodipine  from 5mg  to 10 mg and resending valsartan  prescription to pharmacy. - Recommended continued low sodium diet.  - valsartan  (DIOVAN ) 40 MG tablet; Take 1 tablet (40 mg total) by mouth daily.  Dispense: 90 tablet; Refill: 2 - amLODipine  (NORVASC ) 10 MG tablet; Take 1 tablet (10 mg total) by mouth daily.  Dispense: 90 tablet; Refill: 2  2. Diabetes mellitus screening - Hemoglobin A1c  3. Encounter for lipid screening for cardiovascular disease - Lipid panel  4. Screening for metabolic disorder - Comprehensive metabolic panel with GFR    Patient was given the opportunity to ask questions.  Patient verbalized understanding of the plan and was able to repeat key elements of the plan.    Orders Placed This Encounter  Procedures    Hemoglobin A1c   Comprehensive metabolic panel with GFR   Lipid panel     Requested Prescriptions   Signed Prescriptions Disp Refills   valsartan  (DIOVAN ) 40 MG tablet 90 tablet 2    Sig: Take 1 tablet (40 mg total) by mouth daily.   amLODipine  (NORVASC ) 10 MG tablet 90 tablet 2    Sig: Take 1 tablet (10 mg total) by mouth daily.    Return in about 1 month (around 04/17/2024) for follow-up, physical.  Sula Leavy Rode, PA-C

## 2024-03-18 LAB — LIPID PANEL
Chol/HDL Ratio: 2.5 ratio (ref 0.0–4.4)
Cholesterol, Total: 143 mg/dL (ref 100–199)
HDL: 58 mg/dL (ref >39)
LDL Chol Calc (NIH): 76 mg/dL (ref 0–99)
Triglycerides: 36 mg/dL (ref 0–149)
VLDL Cholesterol Cal: 9 mg/dL (ref 5–40)

## 2024-03-18 LAB — COMPREHENSIVE METABOLIC PANEL WITH GFR
ALT: 34 IU/L — ABNORMAL HIGH (ref 0–32)
AST: 29 IU/L (ref 0–40)
Albumin: 4.7 g/dL (ref 3.8–4.9)
Alkaline Phosphatase: 116 IU/L (ref 49–135)
BUN/Creatinine Ratio: 20 (ref 9–23)
BUN: 12 mg/dL (ref 6–24)
Bilirubin Total: 0.4 mg/dL (ref 0.0–1.2)
CO2: 23 mmol/L (ref 20–29)
Calcium: 9.9 mg/dL (ref 8.7–10.2)
Chloride: 100 mmol/L (ref 96–106)
Creatinine, Ser: 0.6 mg/dL (ref 0.57–1.00)
Globulin, Total: 2.8 g/dL (ref 1.5–4.5)
Glucose: 93 mg/dL (ref 70–99)
Potassium: 3.9 mmol/L (ref 3.5–5.2)
Sodium: 138 mmol/L (ref 134–144)
Total Protein: 7.5 g/dL (ref 6.0–8.5)
eGFR: 107 mL/min/1.73 (ref >59)

## 2024-03-18 LAB — HEMOGLOBIN A1C
Est. average glucose Bld gHb Est-mCnc: 117 mg/dL
Hgb A1c MFr Bld: 5.7 % — ABNORMAL HIGH (ref 4.8–5.6)

## 2024-03-21 ENCOUNTER — Ambulatory Visit: Payer: Self-pay

## 2024-04-07 ENCOUNTER — Ambulatory Visit

## 2024-04-17 ENCOUNTER — Other Ambulatory Visit: Payer: Self-pay

## 2024-04-24 ENCOUNTER — Ambulatory Visit

## 2024-04-24 ENCOUNTER — Other Ambulatory Visit: Payer: Self-pay

## 2024-04-24 VITALS — BP 180/111 | HR 91 | Temp 97.6°F | Resp 16 | Ht 64.0 in | Wt 165.4 lb

## 2024-04-24 DIAGNOSIS — I10 Essential (primary) hypertension: Secondary | ICD-10-CM | POA: Diagnosis not present

## 2024-04-24 DIAGNOSIS — Z0000 Encounter for general adult medical examination without abnormal findings: Secondary | ICD-10-CM

## 2024-04-24 MED ORDER — VALSARTAN-HYDROCHLOROTHIAZIDE 160-12.5 MG PO TABS
1.0000 | ORAL_TABLET | Freq: Every day | ORAL | 2 refills | Status: DC
  Filled 2024-04-24: qty 30, 30d supply, fill #0

## 2024-04-24 MED ORDER — VALSARTAN-HYDROCHLOROTHIAZIDE 160-12.5 MG PO TABS
1.0000 | ORAL_TABLET | Freq: Every day | ORAL | 2 refills | Status: DC
  Filled 2024-04-24: qty 30, 30d supply, fill #0
  Filled 2024-05-29: qty 30, 30d supply, fill #1

## 2024-04-24 NOTE — Progress Notes (Signed)
 Patient ID: Dawn Pope, female    DOB: 1971/05/11  MRN: [id number]  CC: No chief complaint on file.   Subjective: Dawn Pope is a 53 y.o. female with past medical history of hypertension who presents to clinic for follow-up and annual physical exam.  Patient reports that at home blood pressure readings are around 140.  Reports she is taking medication as prescribed.   ROS: Review of Systems Negative except as stated above  PHYSICAL EXAM: BP (!) 180/111   Pulse 91   Temp 97.6 F (36.4 C) (Oral)   Resp 16   Ht 5' 4 (1.626 m)   Wt 165 lb 6.4 oz (75 kg)   SpO2 98%   BMI 28.39 kg/m   Physical Exam  General: well-appearing, no acute distress Skin: no jaundice, rashes, or lesions Head: normocephalic, no lesions, no abnormal hair distribution or loss Eyes: anicteric sclera, pupils equally round and reactive to light and accommodation, extraocular movements intact Ears: no external lesions, tympanic membrane translucent  Nose: no septal deviation, turbinates clear Throat: trachea midline, no thyromegaly, moist mucus membranes Cardiovascular: regular heart rate and rhythm, normal S1/S2, no murmurs, gallops, or rubs, peripheral pulses 2+ bilaterally Chest: no skeletal deformity, lungs clear to auscultation bilaterally, equal breath sounds bilaterally Abdomen: soft, non-distended, non-tender to palpation, no hepatomegaly, no splenomegaly, normoactive bowel sounds Musculoskeletal: strength of upper and lower extremities equal bilaterally, 5/5, normal gait Extremities: no peripheral edema, nails intact Neurologic: Brisk patellar reflexes, cranial nerves II-XII intact      Latest Ref Rng & Units 03/17/2024    9:17 AM 05/10/2023    3:51 PM 01/25/2023   10:01 AM  CMP  Glucose 70 - 99 mg/dL 93  89  899   BUN 6 - 24 mg/dL 12  15  14    Creatinine 0.57 - 1.00 mg/dL 9.39  9.28  9.32   Sodium 134 - 144 mmol/L 138  141  139   Potassium 3.5 - 5.2 mmol/L 3.9  4.0  3.9   Chloride  96 - 106 mmol/L 100  102  104   CO2 20 - 29 mmol/L 23  29  28    Calcium  8.7 - 10.2 mg/dL 9.9  89.8  9.5   Total Protein 6.0 - 8.5 g/dL 7.5  8.1  6.8   Total Bilirubin 0.0 - 1.2 mg/dL 0.4  0.5  0.5   Alkaline Phos 49 - 135 IU/L 116     AST 0 - 40 IU/L 29  23  19    ALT 0 - 32 IU/L 34  23  17    Lipid Panel     Component Value Date/Time   CHOL 143 03/17/2024 0917   TRIG 36 03/17/2024 0917   HDL 58 03/17/2024 0917   CHOLHDL 2.5 03/17/2024 0917   CHOLHDL 4.7 05/10/2023 1551   VLDL 13 10/07/2015 1436   LDLCALC 76 03/17/2024 0917   LDLCALC 154 (H) 05/10/2023 1551    CBC    Component Value Date/Time   WBC 5.0 05/10/2023 1551   RBC 4.68 05/10/2023 1551   HGB 14.1 05/10/2023 1551   HCT 41.8 05/10/2023 1551   PLT 289 05/10/2023 1551   MCV 89.3 05/10/2023 1551   MCH 30.1 05/10/2023 1551   MCHC 33.7 05/10/2023 1551   RDW 12.1 05/10/2023 1551   LYMPHSABS 1,115 01/25/2023 1001   MONOABS 448 10/07/2015 1436   EOSABS 80 05/10/2023 1551   BASOSABS 30 05/10/2023 1551    ASSESSMENT AND  PLAN:  1. Encounter for annual wellness visit (Primary) - Complete physical exam performed today, no abnormal findings.   2. Primary hypertension BP: 180/111, not at goal.  - Increasing valsartan -hydrochlorothiazide  (DIOVAN -HCT) 160-12.5 MG tablet; Take 1 tablet by mouth daily.  Dispense: 30 tablet; Refill: 2 - Continue Amlodipine  10 mg daily     Patient was given the opportunity to ask questions.  Patient verbalized understanding of the plan and was able to repeat key elements of the plan. Patient was given clear instructions to go to Emergency Department or return to medical center if symptoms don't improve, worsen, or new problems develop.The patient verbalized understanding.   No orders of the defined types were placed in this encounter.    Requested Prescriptions   Signed Prescriptions Disp Refills   valsartan -hydrochlorothiazide  (DIOVAN -HCT) 160-12.5 MG tablet 30 tablet 2    Sig: Take  1 tablet by mouth daily.    No follow-ups on file.  Dawn Leavy Rode, PA-C

## 2024-05-14 ENCOUNTER — Other Ambulatory Visit (HOSPITAL_COMMUNITY): Payer: Self-pay

## 2024-05-15 ENCOUNTER — Telehealth: Payer: Self-pay | Admitting: *Deleted

## 2024-05-15 ENCOUNTER — Other Ambulatory Visit: Payer: Self-pay

## 2024-05-15 DIAGNOSIS — I10 Essential (primary) hypertension: Secondary | ICD-10-CM

## 2024-05-15 NOTE — Telephone Encounter (Signed)
 Reason for CRM: The patient called in stating she would like to request getting a stress test done specifically because her blood pressure has still been up even while on 2 different bp meds. She doesn't know if she needs to be seen again or if her provider can just schedule this. She also stated that her primary care provider said that he may send a referral to see a Cardiologist if her bp is still up. Please assist patient further and let her know as soon as possible what her provider thinks is best

## 2024-05-15 NOTE — Progress Notes (Signed)
 Spoke with patient via phone on 05/15/2024. Patient was identified with two identifiers. Pt reports that blood pressure at-home readings continue to be 170-180 systolic despite initiation of additional agent for the past month. Patient would like referral to Cardiologist per our previous discussion.

## 2024-05-29 ENCOUNTER — Ambulatory Visit

## 2024-05-30 ENCOUNTER — Encounter: Payer: Self-pay | Admitting: Student

## 2024-05-30 ENCOUNTER — Ambulatory Visit: Attending: Student | Admitting: Student

## 2024-05-30 ENCOUNTER — Other Ambulatory Visit: Payer: Self-pay

## 2024-05-30 VITALS — BP 143/86 | HR 110 | Resp 16 | Ht 64.0 in | Wt 166.1 lb

## 2024-05-30 DIAGNOSIS — I10 Essential (primary) hypertension: Secondary | ICD-10-CM

## 2024-05-30 DIAGNOSIS — R000 Tachycardia, unspecified: Secondary | ICD-10-CM

## 2024-05-30 DIAGNOSIS — R0683 Snoring: Secondary | ICD-10-CM | POA: Diagnosis not present

## 2024-05-30 DIAGNOSIS — E785 Hyperlipidemia, unspecified: Secondary | ICD-10-CM | POA: Diagnosis not present

## 2024-05-30 MED ORDER — VALSARTAN-HYDROCHLOROTHIAZIDE 320-25 MG PO TABS
1.0000 | ORAL_TABLET | Freq: Every day | ORAL | 3 refills | Status: AC
  Filled 2024-05-30 – 2024-06-13 (×4): qty 30, 30d supply, fill #0

## 2024-05-30 NOTE — Progress Notes (Signed)
 "  [facility]:    Date:  05/30/2024   ID:  NEVAEN TREDWAY, DOB [birth year], MRN [id number]  PCP:  Leavy Lucas Fox, PA-C  Cardiologist:  None  Cardiology APP:  Cionna Collantes E, PA-C     Referring MD: Leavy Lucas Fox, PA-C   Chief Complaint: hypertension  History of Present Illness:    Dawn Pope is a 53 y.o. female with a history of hypertension, hyperlipidemia, prediabetes, iron deficiency anemia, vitamin D  deficiency, and uterine leiomyoma who presents today as a new patient in the [facility] for further evaluation of hypertension.   Patient reports being diagnosed with hypertension many years ago and was previously on Bisoprolol . She states her BP was well controlled until about 1 year ago. At PCP's office in 12/2023 to get established with a new provider, her BP was 192/125. She was asymptomatic with this. Bisoprolol  was stopped and she was started on Amlodipine  and Valsartan  instead. She has undergone a couple of BP changes since then and is currently on Amlodipine  10mg  daily and Valsartan -HCTZ 160-12.5mg  daily. She has been monitor her BP at home and systolic BP is typically in the 140s. Initial BP in the office was 179/77 but improved to 143/86 on recheck.   She reports very atypical chest pain that she describes as a pinpoint sharp that is sometimes in her chest and sometime on her left side along rib cage. Pain only last for 1-2 seconds and then resolves on its own. She denies any other chest pain/ discomfort. Nothing that sound like angina. No shortness of breath, orthopnea, PND, edema, palpitations, lightheadedness/ dizziness, or syncope. She does report snoring and daytime fatigue/ somnolence but has never been tested for sleep apnea.   Patient has a history of hyperlipidemia and was previously on Lipitor 10mg  daily. However, she is no longer on this and cholesterol was normal on labs (LDL 76) in 03/2024. There is mention of pulmonary  hypertension in prior PCP notes that was reportedly diagnosed on a remote Echo in 2010 that showed a RVSP of 45. However, I cannot see this Echo report and patient denies ever being told this.   She denies any history of tobacco, alcohol, or drug use. She has not family history of heart disease but her father does have a history of a light stroke.  EKGs/Labs/Other Studies Reviewed:    The following studies were reviewed:  N/A  EKG:  EKG ordered today.   EKG Interpretation Date/Time:  Tuesday May 30 2024 14:43:25 EST Ventricular Rate:  107 PR Interval:    QRS Duration:  84 QT Interval:  328 QTC Calculation: 437 R Axis:   52  Text Interpretation: Sinus tachycardia with PACs No acute ST/ T wave changes Confirmed by Kynzie Polgar [phone]) on 05/30/2024 10:04:32 PM    Recent Labs: 03/17/2024: ALT 34 05/30/2024: BUN WILL FOLLOW; Creatinine, Ser WILL FOLLOW; Hemoglobin 13.6; Platelets 252; Potassium WILL FOLLOW; Sodium WILL FOLLOW; TSH WILL FOLLOW  Recent Lipid Panel    Component Value Date/Time   CHOL 143 03/17/2024 0917   TRIG 36 03/17/2024 0917   HDL 58 03/17/2024 0917   CHOLHDL 2.5 03/17/2024 0917   CHOLHDL 4.7 05/10/2023 1551   VLDL 13 10/07/2015 1436   LDLCALC 76 03/17/2024 0917   LDLCALC 154 (H) 05/10/2023 1551    Physical Exam:    Vital Signs: BP (!) 143/86 (BP Location: Left Arm, Patient Position: Sitting, Cuff Size: Normal)   Pulse (!) 110  Resp 16   Ht 5' 4 (1.626 m)   Wt 166 lb 1.6 oz (75.3 kg)   SpO2 100%   BMI 28.51 kg/m     Wt Readings from Last 3 Encounters:  05/30/24 166 lb 1.6 oz (75.3 kg)  04/24/24 165 lb 6.4 oz (75 kg)  03/17/24 166 lb (75.3 kg)     General: 53 y.o. African-American female in no acute distress. HEENT: Normocephalic and atraumatic. Sclera clear.  Neck: Supple. No carotid bruits. No JVD. Heart: RRR. Distinct S1 and S2. No murmurs, gallops, or rubs.  Lungs: No increased work of breathing. Clear to ausculation  bilaterally. No wheezes, rhonchi, or rales.  Extremities: No lower extremity edema.   Skin: Warm and dry. Neuro: No focal deficits. Psych: Normal affect. Responds appropriately.   Assessment:    1. Primary hypertension   2. Tachycardia   3. Hyperlipidemia, unspecified hyperlipidemia type   4. Snoring     Plan:    Hypertension Patient has been struggling with uncontrolled hypertension for over 1 year. Multiple medication adjustments have been made over the last couple of months.  - BP initially 179/77 in the office but improved to 143/86 on recheck.  - Continue Amlodipine  10mg  daily. - Will increase Valsartan -HCTZ to 360-25mg  daily.  - Will check BMET today and then again in 1 week to make sure renal function/ electrolytes are stable on increased dose.  - Will order an Echo to assess for structural heart disease.  - Will order an [facility]mar home sleep study to assess of obstructive sleep apnea.  - Asked patient to keep a BP/ HR log for 2 weeks. If BP remains elevated, will likely add Coreg.   Tachycardia Heart rates is mildly elevated in the office today.  - EKG today shows sinus tachycardia, rate 107 bpm, with PACs.  - Suspect tachycardia may be due to anxiety about today's visit. However, will check BMET, CBC, and TSH today.  - Patient is going to be monitoring her BP/ HR as above.   Hyperlipidemia Patient has a history of hyperlipidemia and was previously on Lipitor. However, most recent lipid panel in 03/2024 showed cholesterol was well controlled (Total Cholesterol 143, Triglycerides 36, HDL 58, LDL 76).  - No need for medications at this time.   Snoring Patient reports snoring and daytime fatigue.  - STOP BANG score = 3 suggesting she is high risk for obstructive sleep apnea. Will order an Itmar sleep study.    Disposition: Follow up in about 3 months.    Signed, Aline FORBES Door, PA-C  05/30/2024 10:30 PM    [facility] "

## 2024-05-30 NOTE — Patient Instructions (Addendum)
 Medication Instructions:  INCREASE Valsartan - hydrochlorothiazide  to 320-25mg   **IF you still have the 160-12.5mg , you can double up until finished, then start the new prescription.**  *If you need a refill on your cardiac medications before your next appointment, please call your pharmacy*  Lab Work: CBC, TSH, BMET If you have labs (blood work) drawn today and your tests are completely normal, you will receive your results only by: MyChart Message (if you have MyChart) OR A paper copy in the mail If you have any lab test that is abnormal or we need to change your treatment, we will call you to review the results.  Testing/Procedures: ECHOCARDIOGRAM (Before appointment on 08/29/24)  Your physician has requested that you have an echocardiogram. Echocardiography is a painless test that uses sound waves to create images of your heart. It provides your doctor with information about the size and shape of your heart and how well your hearts chambers and valves are working. This procedure takes approximately one hour. There are no restrictions for this procedure. Please do NOT wear cologne, perfume, aftershave, or lotions (deodorant is allowed). Please arrive 15 minutes prior to your appointment time.  Please note: We ask at that you not bring children with you during ultrasound (echo/ vascular) testing. Due to room size and safety concerns, children are not allowed in the ultrasound rooms during exams. Our front office staff cannot provide observation of children in our lobby area while testing is being conducted. An adult accompanying a patient to their appointment will only be allowed in the ultrasound room at the discretion of the ultrasound technician under special circumstances. We apologize for any inconvenience.  Follow-Up: At [facility], you and your health needs are our priority.  As part of our continuing mission to provide you with exceptional heart care, our providers are all  part of one team.  This team includes your primary Cardiologist (physician) and Advanced Practice Providers or APPs (Physician Assistants and Nurse Practitioners) who all work together to provide you with the care you need, when you need it.  WatchPAT? is an FDA-cleared portable home sleep study test that uses a watch and 3 points of contact to monitor 7 different channels, including your heart rate, oxygen saturation, body position, snoring, and chest motion.  The study is easy to use from the comfort of your own home and accurately detect sleep apnea.  Before bed, you attach the chest sensor, attached the sleep apnea bracelet to your nondominant hand, and attach the finger probe.  After the study, the raw data is downloaded from the watch and scored for apnea events.   For more information: [email or web address]  Patient Testing Instructions:  Once our office has received insurance approval for you to complete this test, we will contact you with a PIN to activate the device.  This typically takes 2-3 weeks.  Please do not open the box until approved.   Do not put battery into the device until bedtime when you are ready to begin the test. Please call the support number if you need assistance after following the instructions below: 24 hour support line- [phone] or [facility] support at [phone] (option 2)  Download the [facility] WatchPAT One app through the [employer] or [employer]. Be sure to turn on or enable access to Bluetooth in settings on your smartphone. Make sure no other Bluetooth devices are on and within the vicinity of your smartphone and WatchPAT watch during testing.  Make sure to leave your  smart phone plugged in and charging all night.  When ready for bed:  Follow the instructions step by step in the WatchPAT One app to activate the testing device. For additional instructions, including video instruction, visit the WatchPAT One video on Youtube. You  can search for WatchPAT One within Youtube (video is 4 minutes and 18 seconds) or enter: https://youtube/watch?[email or web address]_vbiwxE Please note: You will be prompted to enter a PIN to connect via Bluetooth when starting the test. The PIN will be assigned to you after insurance has approved the test.  The device is disposable, but it recommended that you retain the device until you receive a call letting you know the study has been received and the results have been interpreted.  We will let you know if the study did not transmit to us  properly after the test is completed. You do not need to call us  to confirm the receipt of the test.  Please complete the test within 48 hours of receiving PIN.   Frequently Asked Questions:  What is Watch PAT One?  A single use, fully disposable home sleep apnea testing device and will not need to be returned after completion.  What are the requirements to use WatchPAT One?  A successful WatchPAT One sleep study requires a WatchPAT One device, your smart phone, WatchPAT One app, your PIN number, and internet access. What type of phone do I need?  You should have a smart phone that uses Android 5.1 and above or any iPhone with IOS 10 and above. How can I download the WatchPAT one app?  Based on your device type search for WatchPAT One app either in [employer] for [employer] or [employer] for [employer]. Where will I get my PIN for the study?  Your PIN will be provided by your physician's office after insurance has approved the test. This process typically takes 2-3 weeks. It is used for authentication and if you lose/forget your PIN, please reach out to your provider's office.  I do not have internet at home. Can I still complete a WatchPAT One study?  WatchPAT One needs internet connection throughout the night to be able to transmit the sleep data. You can use your home/local internet or your cellular data package. However, it is always recommended to use  home/local internet. It is estimated that between 20MB-30MB of data will be used with each study, but the application will be looking for space in the phone to start the study.  What happens if I lose internet or Bluetooth connection?  During the internet disconnection, your phone will not be able to transmit the sleep data.  All the data, will be stored in your phone.  As soon as the internet connection is back on, the phone will resume sending the sleep data. During the Bluetooth disconnection, WatchPAT One will not be able to to send the sleep data to your phone.  Data will be kept in the WatchPAT One until both devices have Bluetooth connection back on.  As soon as the connection is back on, WatchPAT one will send the sleep data to the phone.  How long do I need to wear the WatchPAT one?  After you start the study, you should wear the device at least 6 hours.  How far should I keep my phone from the device?  During the night, your phone should remain within 15 feet of where you sleep.  What happens if I leave the room for restroom or  other reasons?  Leaving the room for any reason will not cause any problem. As soon as your get back to the room, both devices will reconnect and will continue to send the sleep data. Can I use my phone during the sleep study?  Yes, you can use your phone as usual during the study. But it is recommended to put your WatchPAT One on when you are ready to go to bed.  How will I get my study results?  A soon as you completed your study, your sleep data will be sent to the provider. They will then share the results with you when they are ready.    Your next appointment:   3 month(s)  August 29, 2024 @9 :15am  Provider:   Aline Door, PA-C      We recommend signing up for the patient portal called MyChart.  Sign up information is provided on this After Visit Summary.  MyChart is used to connect with patients for Virtual Visits (Telemedicine).  Patients are  able to view lab/test results, encounter notes, upcoming appointments, etc.  Non-urgent messages can be sent to your provider as well.   To learn more about what you can do with MyChart, go to [email or web address].   Other Instructions Blood Pressure Record Sheet To take your blood pressure, you will need a blood pressure machine. You can buy a blood pressure machine (blood pressure monitor) at your clinic, drug store, or online. When choosing one, consider: An automatic monitor that has an arm cuff. A cuff that wraps snugly around your upper arm. You should be able to fit only one finger between your arm and the cuff. A device that stores blood pressure reading results. Do not choose a monitor that measures your blood pressure from your wrist or finger. Follow your health care provider's instructions for how to take your blood pressure. To use this form: Take your blood pressure medications every day These measurements should be taken when you have been at rest for at least 10-15 min Take at least 2 readings with each blood pressure check. This makes sure the results are correct. Wait 1-2 minutes between measurements. Write down the results in the spaces on this form. Keep in mind it should always be recorded systolic over diastolic. Both numbers are important.  Repeat this every day for 2-3 weeks, or as told by your health care provider.  Make a follow-up appointment with your health care provider to discuss the results.  Blood Pressure Log Date Medications taken? (Y/N) Blood Pressure Time of Day

## 2024-05-31 LAB — CBC
Hematocrit: 41.6 % (ref 34.0–46.6)
Hemoglobin: 13.6 g/dL (ref 11.1–15.9)
MCH: 30.1 pg (ref 26.6–33.0)
MCHC: 32.7 g/dL (ref 31.5–35.7)
MCV: 92 fL (ref 79–97)
Platelets: 252 x10E3/uL (ref 150–450)
RBC: 4.52 x10E6/uL (ref 3.77–5.28)
RDW: 12.7 % (ref 11.7–15.4)
WBC: 6.3 x10E3/uL (ref 3.4–10.8)

## 2024-05-31 LAB — BASIC METABOLIC PANEL WITH GFR
BUN/Creatinine Ratio: 18 (ref 9–23)
BUN: 13 mg/dL (ref 6–24)
CO2: 24 mmol/L (ref 20–29)
Calcium: 10.4 mg/dL — ABNORMAL HIGH (ref 8.7–10.2)
Chloride: 101 mmol/L (ref 96–106)
Creatinine, Ser: 0.72 mg/dL (ref 0.57–1.00)
Glucose: 98 mg/dL (ref 70–99)
Potassium: 3.5 mmol/L (ref 3.5–5.2)
Sodium: 142 mmol/L (ref 134–144)
eGFR: 100 mL/min/1.73

## 2024-05-31 LAB — TSH: TSH: 1.33 u[IU]/mL (ref 0.450–4.500)

## 2024-06-01 ENCOUNTER — Ambulatory Visit: Payer: Self-pay | Admitting: Student

## 2024-06-01 DIAGNOSIS — I10 Essential (primary) hypertension: Secondary | ICD-10-CM

## 2024-06-06 ENCOUNTER — Ambulatory Visit: Admitting: Cardiology

## 2024-06-09 ENCOUNTER — Other Ambulatory Visit: Payer: Self-pay | Admitting: *Deleted

## 2024-06-09 ENCOUNTER — Other Ambulatory Visit: Payer: Self-pay

## 2024-06-09 DIAGNOSIS — I10 Essential (primary) hypertension: Secondary | ICD-10-CM

## 2024-06-09 LAB — BASIC METABOLIC PANEL WITH GFR
BUN/Creatinine Ratio: 18 (ref 9–23)
BUN: 14 mg/dL (ref 6–24)
CO2: 27 mmol/L (ref 20–29)
Calcium: 10.2 mg/dL (ref 8.7–10.2)
Chloride: 102 mmol/L (ref 96–106)
Creatinine, Ser: 0.79 mg/dL (ref 0.57–1.00)
Glucose: 81 mg/dL (ref 70–99)
Potassium: 3.7 mmol/L (ref 3.5–5.2)
Sodium: 143 mmol/L (ref 134–144)
eGFR: 89 mL/min/1.73

## 2024-06-10 ENCOUNTER — Ambulatory Visit: Payer: Self-pay | Admitting: Student

## 2024-06-13 ENCOUNTER — Other Ambulatory Visit: Payer: Self-pay

## 2024-06-13 ENCOUNTER — Other Ambulatory Visit (HOSPITAL_COMMUNITY): Payer: Self-pay

## 2024-06-15 ENCOUNTER — Ambulatory Visit

## 2024-06-19 ENCOUNTER — Other Ambulatory Visit (HOSPITAL_COMMUNITY): Payer: Self-pay

## 2024-07-04 ENCOUNTER — Ambulatory Visit (HOSPITAL_COMMUNITY)
Admission: RE | Admit: 2024-07-04 | Discharge: 2024-07-04 | Disposition: A | Source: Ambulatory Visit | Attending: Student | Admitting: Student

## 2024-07-04 DIAGNOSIS — I10 Essential (primary) hypertension: Secondary | ICD-10-CM

## 2024-07-04 LAB — ECHOCARDIOGRAM COMPLETE
Area-P 1/2: 4.24 cm2
S' Lateral: 2.1 cm

## 2024-08-29 ENCOUNTER — Ambulatory Visit: Admitting: Student
